# Patient Record
Sex: Female | Born: 1995
Health system: Southern US, Community
[De-identification: ages and names within clinical notes are randomized; demographics above are authoritative.]

## PROBLEM LIST (undated history)

## (undated) ENCOUNTER — Emergency Department (HOSPITAL_COMMUNITY): Admission: EM | Payer: Medicaid Other

## (undated) DIAGNOSIS — R87629 Unspecified abnormal cytological findings in specimens from vagina: Secondary | ICD-10-CM

## (undated) DIAGNOSIS — B009 Herpesviral infection, unspecified: Secondary | ICD-10-CM

## (undated) HISTORY — PX: WISDOM TOOTH EXTRACTION: SHX21

## (undated) HISTORY — DX: Unspecified abnormal cytological findings in specimens from vagina: R87.629

---
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
## 2015-07-03 ENCOUNTER — Emergency Department (HOSPITAL_COMMUNITY): Payer: Worker's Compensation

## 2015-07-03 ENCOUNTER — Emergency Department (HOSPITAL_COMMUNITY)
Admission: EM | Admit: 2015-07-03 | Discharge: 2015-07-03 | Disposition: A | Discharge status: Home / Self Care | Payer: Worker's Compensation | Attending: Emergency Medicine | Admitting: Emergency Medicine

## 2015-07-03 ENCOUNTER — Encounter (HOSPITAL_COMMUNITY): Payer: Self-pay | Admitting: *Deleted

## 2015-07-03 DIAGNOSIS — S92355A Nondisplaced fracture of fifth metatarsal bone, left foot, initial encounter for closed fracture: Secondary | ICD-10-CM | POA: Diagnosis not present

## 2015-07-03 DIAGNOSIS — Y9289 Other specified places as the place of occurrence of the external cause: Secondary | ICD-10-CM | POA: Diagnosis not present

## 2015-07-03 DIAGNOSIS — W231XXA Caught, crushed, jammed, or pinched between stationary objects, initial encounter: Secondary | ICD-10-CM | POA: Diagnosis not present

## 2015-07-03 DIAGNOSIS — Y9389 Activity, other specified: Secondary | ICD-10-CM | POA: Insufficient documentation

## 2015-07-03 DIAGNOSIS — S92245A Nondisplaced fracture of medial cuneiform of left foot, initial encounter for closed fracture: Secondary | ICD-10-CM | POA: Diagnosis not present

## 2015-07-03 DIAGNOSIS — T148XXA Other injury of unspecified body region, initial encounter: Secondary | ICD-10-CM

## 2015-07-03 DIAGNOSIS — S9782XA Crushing injury of left foot, initial encounter: Secondary | ICD-10-CM | POA: Diagnosis not present

## 2015-07-03 DIAGNOSIS — Z720 Tobacco use: Secondary | ICD-10-CM | POA: Insufficient documentation

## 2015-07-03 DIAGNOSIS — S99922A Unspecified injury of left foot, initial encounter: Secondary | ICD-10-CM | POA: Diagnosis present

## 2015-07-03 DIAGNOSIS — Y990 Civilian activity done for income or pay: Secondary | ICD-10-CM | POA: Diagnosis not present

## 2015-07-03 DIAGNOSIS — S92902A Unspecified fracture of left foot, initial encounter for closed fracture: Secondary | ICD-10-CM

## 2015-07-03 MED ORDER — ONDANSETRON HCL 4 MG/2ML IJ SOLN
4.0000 mg | Freq: Once | INTRAMUSCULAR | Status: AC
  Administered 2015-07-03: 4 mg via INTRAVENOUS
  Filled 2015-07-03: qty 2

## 2015-07-03 MED ORDER — HYDROMORPHONE HCL 1 MG/ML IJ SOLN
1.0000 mg | Freq: Once | INTRAMUSCULAR | Status: AC
  Administered 2015-07-03: 1 mg via INTRAVENOUS
  Filled 2015-07-03: qty 1

## 2015-07-03 MED ORDER — OXYCODONE-ACETAMINOPHEN 5-325 MG PO TABS: 1.0000 | ORAL_TABLET | ORAL | Status: DC | PRN

## 2015-07-03 NOTE — ED Notes (Signed)
Pt is in stable condition upon d/c and is escorted from ED via wheelchair. This RN helped pt into car and explained in length how to properly care for her broken foot. Pt expresses understanding.

## 2015-07-03 NOTE — ED Provider Notes (Signed)
SPLINT APPLICATION Date/Time: 6:24 PM Authorized by: Joya GaskinsWICKLINE,Rodarius Kichline W Consent: Verbal consent obtained. Risks and benefits: risks, benefits and alternatives were discussed Consent given by: patient Splint applied by: orthopedic technician Location details: left foot Splint type: bulky jones dressing per dr Roda Shuttersxu Post-procedure: The splinted body part was neurovascularly unchanged following the procedure. Patient tolerance: Patient tolerated the procedure well with no immediate complications.     April Rhineonald Halea Lieb, MD 07/03/15 [phone]

## 2015-07-03 NOTE — ED Provider Notes (Signed)
Dr Roda Shuttersxu is comfortable with CT results D/c patient home and f/u next week She is to wear a bulky jones dressing/crutches    Zadie Rhineonald Baylin Cabal, MD 07/03/15 1735

## 2015-07-03 NOTE — ED Notes (Signed)
[facility] 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 Pt is tearful upon arrival. Pt has obvious deformities to left foot. Pt also has complaints to right foot and ankle.

## 2015-07-03 NOTE — ED Notes (Signed)
Pt ambulates to bathroom on crutches with no problems. Pt provided UDS for worker's comp.

## 2015-07-03 NOTE — ED Notes (Signed)
Pt refuses to demonstrate use of crutches with ortho tech. This RN states we need to see her use the crutches before she can leave. The pt then yells, "I don't know how to do this!". Pt asked to not yell at this RN and instructed again on how to properly use crutches. The family is standing with pt.

## 2015-07-03 NOTE — Progress Notes (Signed)
Orthopedic Tech Progress Note Patient Details:  April HammersmithRaha S Shah 03/19/1996 [id number]  Ortho Devices Type of Ortho Device: Ace wrap, April Simmondsobert Jones splint Ortho Device/Splint Interventions: Application   April FordyceJennifer C Lucas Shah 07/03/2015, 6:20 PM

## 2015-07-03 NOTE — ED Notes (Signed)
Patient transported to X-ray 

## 2015-07-03 NOTE — ED Provider Notes (Signed)
CSN: [id number]     Arrival date & time 07/03/15  1327 History   First MD Initiated Contact with Patient 07/03/15 1338     Chief Complaint  Patient presents with  . Foot Pain     (Consider location/radiation/quality/duration/timing/severity/associated sxs/prior Treatment) HPI   Pt presents with left foot and ankle pain that is sharp, throbbing, aching, constant, severe, worse with movement and palpation and mild right heel pain that occurred after foot being caught between forklift and railing at work.  Was given  morphine en route by EMS with no improvement of pain.  Denies weakness or numbness of the feet.  Denies injury.    History reviewed. No pertinent past medical history. History reviewed. No pertinent past surgical history. No family history on file. Social History  Substance Use Topics  . Smoking status: Current Every Day Smoker -- 0.25 packs/day    Types: Cigarettes  . Smokeless tobacco: None  . Alcohol Use: No   OB History    No data available     Review of Systems  Constitutional: Negative for fever.  Cardiovascular: Negative for leg swelling.  Musculoskeletal: Positive for arthralgias and gait problem.  Skin: Positive for color change.  Allergic/Immunologic: Negative for immunocompromised state.  Neurological: Negative for weakness and numbness.  Psychiatric/Behavioral: Negative for self-injury (accidental).      Allergies  Review of patient's allergies indicates not on file.  Home Medications   Prior to Admission medications   Not on File   Temp(Src) 97.8 F (36.6 C) (Oral)  Ht  (1.549 m)  Wt 115 lb (52.164 kg)  BMI 21.74 kg/m2  SpO2 98% Physical Exam  Constitutional: She appears well-developed and well-nourished.  Uncomfortable, tearful   HENT:  Head: Normocephalic and atraumatic.  Neck: Neck supple.  Pulmonary/Chest: Effort normal.  Musculoskeletal:       Right ankle: Tenderness.       Left ankle: She exhibits no swelling, no  ecchymosis, no deformity and no laceration. Tenderness.       Right lower leg: Normal.       Left lower leg: Normal.       Right foot: There is tenderness.       Left foot: There is tenderness, bony tenderness, swelling and deformity. There is normal capillary refill.       Feet:  Bilateral feet - sensation intact, moves toes, capillary refill < 3 seconds.   Neurological: She is alert.  Skin: She is not diaphoretic.  Nursing note and vitals reviewed.   ED Course  Procedures (including critical care time) Labs Review Labs Reviewed - No data to display  Imaging Review Dg Ankle Complete Left  07/03/2015  CLINICAL DATA:  19 year old female with a history of injury at work. EXAM: LEFT ANKLE COMPLETE - 3+ VIEW COMPARISON:  None. FINDINGS: There is no evidence of fracture, dislocation, or joint effusion. There is no evidence of arthropathy or other focal bone abnormality. Soft tissues are unremarkable. IMPRESSION: Negative. Signed, Yvone Neu. Loreta Ave, DO [facility] Electronically Signed   By: Gilmer Mor D.O.   On: 07/03/2015 14:41   Dg Ankle Complete Right  07/03/2015  CLINICAL DATA:  Injury at work pain to medial malleolus EXAM: RIGHT ANKLE - COMPLETE 3+ VIEW COMPARISON:  None. FINDINGS: There is no evidence of fracture, dislocation, or joint effusion. There is no evidence of arthropathy or other focal bone abnormality. Soft tissues are unremarkable. IMPRESSION: Negative. Electronically Signed   By: Ladona Ridgel  Bradly ChrisStroud M.D.   On: 07/03/2015 14:41   Dg Foot Complete Left  07/03/2015  CLINICAL DATA:  Injury at work. EXAM: LEFT FOOT - COMPLETE 3+ VIEW COMPARISON:  None. FINDINGS: There is an acute fracture involving the distal shaft of the fifth metatarsal bone. Fracture involving the base of the fourth metatarsal bone is also identified. Nondisplaced fracture involving the medial cuneiform also identified. IMPRESSION: 1. Multiple fractures are  noted involving involving the medial cuneiform, base of fourth metatarsal bone and distal shaft of fifth metatarsal. Electronically Signed   By: Signa Kellaylor  Stroud M.D.   On: 07/03/2015 14:44   Dg Foot Complete Right  07/03/2015  CLINICAL DATA:  Injury at work. EXAM: RIGHT FOOT COMPLETE - 3+ VIEW COMPARISON:  None. FINDINGS: There is no evidence of fracture or dislocation. There is no evidence of arthropathy or other focal bone abnormality. Soft tissues are unremarkable. IMPRESSION: Negative. Electronically Signed   By: Signa Kellaylor  Stroud M.D.   On: 07/03/2015 14:40   I have personally reviewed and evaluated these images and lab results as part of my medical decision-making.   EKG Interpretation None       3:16 PM Discussed pt with Dr Roda ShuttersXu who recommends CT scan foot.   Pt updated on plan.    3:49 PM Discussed pt with Dr Bebe ShaggyWickline who assumes care of patient pending CT scan and discussion with Dr Roda ShuttersXu.   MDM   Final diagnoses:  Crush injury  Foot fracture, left, closed, initial encounter    Afebrile nontoxic patient with foot injury at work.  Neurovascularly intact. Xrays shows multiple fractures of left foot.  Discussed with Dr Roda ShuttersXu who recommends CT scan and discussion prior to decision regarding management.  Signed out to Dr Bebe ShaggyWickline at change of shift pending CT and discussion with Dr Roda ShuttersXu (orthopedics).    Trixie Dredgemily Sayra Frisby, PA-C 07/03/15 1551  Mirian MoMatthew Gentry, MD 07/08/15 1414

## 2017-01-03 DIAGNOSIS — Z6820 Body mass index (BMI) 20.0-20.9, adult: Secondary | ICD-10-CM | POA: Diagnosis not present

## 2017-01-03 DIAGNOSIS — Z01419 Encounter for gynecological examination (general) (routine) without abnormal findings: Secondary | ICD-10-CM | POA: Diagnosis not present

## 2017-06-27 DIAGNOSIS — Z6821 Body mass index (BMI) 21.0-21.9, adult: Secondary | ICD-10-CM | POA: Diagnosis not present

## 2017-06-27 DIAGNOSIS — J321 Chronic frontal sinusitis: Secondary | ICD-10-CM | POA: Diagnosis not present

## 2017-10-04 DIAGNOSIS — Z3202 Encounter for pregnancy test, result negative: Secondary | ICD-10-CM | POA: Diagnosis not present

## 2017-10-04 DIAGNOSIS — Z6821 Body mass index (BMI) 21.0-21.9, adult: Secondary | ICD-10-CM | POA: Diagnosis not present

## 2017-10-04 DIAGNOSIS — Z113 Encounter for screening for infections with a predominantly sexual mode of transmission: Secondary | ICD-10-CM | POA: Diagnosis not present

## 2017-10-04 DIAGNOSIS — B977 Papillomavirus as the cause of diseases classified elsewhere: Secondary | ICD-10-CM | POA: Diagnosis not present

## 2017-10-10 DIAGNOSIS — Z3049 Encounter for surveillance of other contraceptives: Secondary | ICD-10-CM | POA: Diagnosis not present

## 2017-10-18 DIAGNOSIS — R05 Cough: Secondary | ICD-10-CM | POA: Diagnosis not present

## 2017-10-18 DIAGNOSIS — F17200 Nicotine dependence, unspecified, uncomplicated: Secondary | ICD-10-CM | POA: Diagnosis not present

## 2017-10-18 DIAGNOSIS — J111 Influenza due to unidentified influenza virus with other respiratory manifestations: Secondary | ICD-10-CM | POA: Diagnosis not present

## 2017-10-18 DIAGNOSIS — J1089 Influenza due to other identified influenza virus with other manifestations: Secondary | ICD-10-CM | POA: Diagnosis not present

## 2017-10-25 DIAGNOSIS — Z3202 Encounter for pregnancy test, result negative: Secondary | ICD-10-CM | POA: Diagnosis not present

## 2017-10-25 DIAGNOSIS — Z3046 Encounter for surveillance of implantable subdermal contraceptive: Secondary | ICD-10-CM | POA: Diagnosis not present

## 2018-01-29 DIAGNOSIS — Z6822 Body mass index (BMI) 22.0-22.9, adult: Secondary | ICD-10-CM | POA: Diagnosis not present

## 2018-01-29 DIAGNOSIS — J0101 Acute recurrent maxillary sinusitis: Secondary | ICD-10-CM | POA: Diagnosis not present

## 2018-05-09 DIAGNOSIS — Z0000 Encounter for general adult medical examination without abnormal findings: Secondary | ICD-10-CM | POA: Diagnosis not present

## 2018-05-09 DIAGNOSIS — J0101 Acute recurrent maxillary sinusitis: Secondary | ICD-10-CM | POA: Diagnosis not present

## 2018-05-09 DIAGNOSIS — Z6822 Body mass index (BMI) 22.0-22.9, adult: Secondary | ICD-10-CM | POA: Diagnosis not present

## 2018-05-09 DIAGNOSIS — Z6821 Body mass index (BMI) 21.0-21.9, adult: Secondary | ICD-10-CM | POA: Diagnosis not present

## 2018-10-09 DIAGNOSIS — N898 Other specified noninflammatory disorders of vagina: Secondary | ICD-10-CM | POA: Diagnosis not present

## 2018-10-23 DIAGNOSIS — N898 Other specified noninflammatory disorders of vagina: Secondary | ICD-10-CM | POA: Diagnosis not present

## 2018-10-31 DIAGNOSIS — Z6821 Body mass index (BMI) 21.0-21.9, adult: Secondary | ICD-10-CM | POA: Diagnosis not present

## 2018-10-31 DIAGNOSIS — A6004 Herpesviral vulvovaginitis: Secondary | ICD-10-CM | POA: Diagnosis not present

## 2019-02-07 DIAGNOSIS — Z880 Allergy status to penicillin: Secondary | ICD-10-CM | POA: Diagnosis not present

## 2019-02-07 DIAGNOSIS — R05 Cough: Secondary | ICD-10-CM | POA: Diagnosis not present

## 2019-02-07 DIAGNOSIS — F17200 Nicotine dependence, unspecified, uncomplicated: Secondary | ICD-10-CM | POA: Diagnosis not present

## 2019-02-07 DIAGNOSIS — Z20828 Contact with and (suspected) exposure to other viral communicable diseases: Secondary | ICD-10-CM | POA: Diagnosis not present

## 2019-02-07 DIAGNOSIS — J029 Acute pharyngitis, unspecified: Secondary | ICD-10-CM | POA: Diagnosis not present

## 2019-02-07 DIAGNOSIS — R0989 Other specified symptoms and signs involving the circulatory and respiratory systems: Secondary | ICD-10-CM | POA: Diagnosis not present

## 2019-02-10 DIAGNOSIS — R05 Cough: Secondary | ICD-10-CM | POA: Diagnosis not present

## 2019-02-10 DIAGNOSIS — Z20828 Contact with and (suspected) exposure to other viral communicable diseases: Secondary | ICD-10-CM | POA: Diagnosis not present

## 2019-02-10 DIAGNOSIS — R0981 Nasal congestion: Secondary | ICD-10-CM | POA: Diagnosis not present

## 2019-02-10 DIAGNOSIS — R5383 Other fatigue: Secondary | ICD-10-CM | POA: Diagnosis not present

## 2019-02-10 DIAGNOSIS — R51 Headache: Secondary | ICD-10-CM | POA: Diagnosis not present

## 2019-02-17 DIAGNOSIS — J301 Allergic rhinitis due to pollen: Secondary | ICD-10-CM | POA: Diagnosis not present

## 2019-02-17 DIAGNOSIS — Z6821 Body mass index (BMI) 21.0-21.9, adult: Secondary | ICD-10-CM | POA: Diagnosis not present

## 2020-07-20 ENCOUNTER — Other Ambulatory Visit: Payer: Self-pay

## 2020-07-20 ENCOUNTER — Ambulatory Visit
Admission: EM | Admit: 2020-07-20 | Discharge: 2020-07-20 | Disposition: A | Discharge status: Home / Self Care | Payer: No Typology Code available for payment source

## 2020-07-20 DIAGNOSIS — A6004 Herpesviral vulvovaginitis: Secondary | ICD-10-CM | POA: Diagnosis not present

## 2020-07-20 LAB — POCT URINE PREGNANCY: Preg Test, Ur: NEGATIVE

## 2020-07-20 MED ORDER — ACYCLOVIR 5 % EX OINT: 1.0000 "application " | TOPICAL_OINTMENT | CUTANEOUS | 0 refills | Status: DC

## 2020-07-20 MED ORDER — ACYCLOVIR 800 MG PO TABS: 800.0000 mg | ORAL_TABLET | Freq: Two times a day (BID) | ORAL | 0 refills | Status: AC

## 2020-07-20 NOTE — Discharge Instructions (Addendum)
Urine pregnancy negative Acyclovir prescribed.  Take as directed and to completion Follow up with PCP for recheck Return here or go to ER if you have any new or worsening symptoms fever, chills, nausea, vomiting, redness, swelling, vaginal discharge, etc..Marland Kitchen

## 2020-07-20 NOTE — ED Triage Notes (Signed)
Pt presents with herpes outbreak for past 2 weeks, has been taking 500 mg valtrex daily but isnt working, pt is also concerned with pregnancy .

## 2020-07-20 NOTE — ED Provider Notes (Signed)
[facility]   [id number] 07/20/20 Arrival Time: 1438   CC: Herpes outbreak  SUBJECTIVE:  April Shah is a 24 y.o. female who presents with herpes outbreak x 4-5 days.  Reports increase in stress and recently sexual activity as well as starting menstrual cycle.  Has been taking 500 mg daily of valtrex without relief.  Reports similar symptoms in the past that improved with acyclovir cream.  Denies fever, chills, nausea, vomiting, abdominal or pelvic pain, urinary symptoms, vaginal itching, vaginal odor, vaginal bleeding, dyspareunia, vaginal rashes.  Also request pregnancy test.    ROS: As per HPI.  All other pertinent ROS negative.     History reviewed. No pertinent past medical history. History reviewed. No pertinent surgical history. Allergies  Allergen Reactions  . Penicillins Anaphylaxis   No current facility-administered medications on file prior to encounter.   Current Outpatient Medications on File Prior to Encounter  Medication Sig Dispense Refill  . etonogestrel (NEXPLANON) 68 MG IMPL implant 1 each by Subdermal route once.    Marland Kitchen oxyCODONE-acetaminophen (PERCOCET/ROXICET) 5-325 MG tablet Take 1 tablet by mouth every 4 (four) hours as needed for severe pain. 15 tablet 0   Social History   Socioeconomic History  . Marital status: Married    Spouse name: Not on file  . Number of children: Not on file  . Years of education: Not on file  . Highest education level: Not on file  Occupational History  . Not on file  Tobacco Use  . Smoking status: Current Every Day Smoker    Packs/day: 0.25    Types: Cigarettes  Substance and Sexual Activity  . Alcohol use: No  . Drug use: No  . Sexual activity: Not on file  Other Topics Concern  . Not on file  Social History Narrative  . Not on file   Social Determinants of Health   Financial Resource Strain:   . Difficulty of Paying Living Expenses: Not on file  Food Insecurity:   . Worried About Brewing technologist in the Last Year: Not on file  . Ran Out of Food in the Last Year: Not on file  Transportation Needs:   . Lack of Transportation (Medical): Not on file  . Lack of Transportation (Non-Medical): Not on file  Physical Activity:   . Days of Exercise per Week: Not on file  . Minutes of Exercise per Session: Not on file  Stress:   . Feeling of Stress : Not on file  Social Connections:   . Frequency of Communication with Friends and Family: Not on file  . Frequency of Social Gatherings with Friends and Family: Not on file  . Attends Religious Services: Not on file  . Active Member of Clubs or Organizations: Not on file  . Attends Banker Meetings: Not on file  . Marital Status: Not on file  Intimate Partner Violence:   . Fear of Current or Ex-Partner: Not on file  . Emotionally Abused: Not on file  . Physically Abused: Not on file  . Sexually Abused: Not on file   History reviewed. No pertinent family history.  OBJECTIVE:  Vitals:   07/20/20 1453  BP: 124/74  Pulse: 86  Resp: 18  Temp: 99 F (37.2 C)  SpO2: 99%     General appearance: alert, NAD, appears stated age Head: NCAT Throat: lips, mucosa, and tongue normal; teeth and gums normal Lungs: CTA bilaterally without adventitious breath sounds Heart: regular rate and rhythm.  Radial pulses 2+ symmetrical bilaterally Back: no CVA tenderness Abdomen: soft, non-tender; bowel sounds normal; no guarding or rebound tenderness GU: deferred Skin: warm and dry Psychological:  Alert and cooperative. Normal mood and affect.  LABS:  Results for orders placed or performed during the hospital encounter of 07/20/20 (from the past 24 hour(s))  POCT urine pregnancy     Status: None   Collection Time: 07/20/20  3:00 PM  Result Value Ref Range   Preg Test, Ur Negative Negative     Labs Reviewed  POCT URINE PREGNANCY    ASSESSMENT & PLAN:  1. Herpes simplex vulvovaginitis     Meds ordered this encounter    Medications  . acyclovir (ZOVIRAX) 800 MG tablet    Sig: Take 1 tablet (800 mg total) by mouth 2 (two) times daily for 5 days.    Dispense:  10 tablet    Refill:  0    Order Specific Question:   Supervising Provider    Answer:   Eustace Moore [[id number]]  . acyclovir ointment (ZOVIRAX) 5 %    Sig: Apply 1 application topically every 3 (three) hours.    Dispense:  30 g    Refill:  0    Order Specific Question:   Supervising Provider    Answer:   Eustace Moore [[id number]]    Pending: Labs Reviewed  POCT URINE PREGNANCY    Urine pregnancy negative Acyclovir prescribed.  Take as directed and to completion Follow up with PCP for recheck Return here or go to ER if you have any new or worsening symptoms fever, chills, nausea, vomiting, redness, swelling, vaginal discharge, etc...  Reviewed expectations re: course of current medical issues. Questions answered. Outlined signs and symptoms indicating need for more acute intervention. Patient verbalized understanding. After Visit Summary given.       Rennis Harding, PA-C 07/20/20 1513

## 2021-09-05 DIAGNOSIS — Z01419 Encounter for gynecological examination (general) (routine) without abnormal findings: Secondary | ICD-10-CM | POA: Diagnosis not present

## 2021-12-06 ENCOUNTER — Ambulatory Visit
Admission: EM | Admit: 2021-12-06 | Discharge: 2021-12-06 | Disposition: A | Discharge status: Home / Self Care | Payer: 59

## 2021-12-06 ENCOUNTER — Encounter: Payer: Self-pay | Admitting: Emergency Medicine

## 2021-12-06 ENCOUNTER — Ambulatory Visit (INDEPENDENT_AMBULATORY_CARE_PROVIDER_SITE_OTHER): Payer: 59

## 2021-12-06 DIAGNOSIS — S90812A Abrasion, left foot, initial encounter: Secondary | ICD-10-CM | POA: Diagnosis not present

## 2021-12-06 DIAGNOSIS — S9032XA Contusion of left foot, initial encounter: Secondary | ICD-10-CM | POA: Diagnosis not present

## 2021-12-06 DIAGNOSIS — M79672 Pain in left foot: Secondary | ICD-10-CM

## 2021-12-06 DIAGNOSIS — S90819A Abrasion, unspecified foot, initial encounter: Secondary | ICD-10-CM

## 2021-12-06 HISTORY — DX: Herpesviral infection, unspecified: B00.9

## 2021-12-06 MED ORDER — NAPROXEN 500 MG PO TABS: 500.0000 mg | ORAL_TABLET | Freq: Two times a day (BID) | ORAL | 0 refills | Status: DC

## 2021-12-06 MED ORDER — TETANUS-DIPHTH-ACELL PERTUSSIS 5-2.5-18.5 LF-MCG/0.5 IM SUSY
0.5000 mL | PREFILLED_SYRINGE | Freq: Once | INTRAMUSCULAR | Status: AC
  Administered 2021-12-06: 0.5 mL via INTRAMUSCULAR

## 2021-12-06 NOTE — ED Provider Notes (Signed)
?[place] ? ? ?MRN: [id number] DOB: 1996-08-03 ? ?Subjective:  ? ?April Shah is a 26 y.o. female presenting for suffering a right foot injury today.  Patient states that she was hurrying to get in a car away from a dog.  Unfortunately she did not pull her foot all the way in before closing the door on her left foot.  This caused a skin tear and has since had pain and swelling.  Wanted to make sure that she did not have a fracture.  Tdap is not up-to-date. ? ?No current facility-administered medications for this encounter. ? ?Current Outpatient Medications:  ?  valACYclovir (VALTREX) 500 MG tablet, Take 500 mg by mouth 2 (two) times daily., Disp: , Rfl:   ? ?No Known Allergies ? ?Past Medical History:  ?Diagnosis Date  ? Herpes simplex   ?  ? ?History reviewed. No pertinent surgical history. ? ?History reviewed. No pertinent family history. ? ?Social History  ? ?Tobacco Use  ? Smoking status: Former  ?  Types: Cigarettes  ? Smokeless tobacco: Never  ?Vaping Use  ? Vaping Use: Never used  ?Substance Use Topics  ? Alcohol use: Never  ? Drug use: Never  ? ? ?ROS ? ? ?Objective:  ? ?Vitals: ?BP 110/63 (BP Location: Right Arm)   Pulse 83   Temp 98.8 ?F (37.1 ?C) (Oral)   Resp 18   LMP 11/21/2021 (Exact Date)   SpO2 97%  ? ?Physical Exam ?Constitutional:   ?   General: She is not in acute distress. ?   Appearance: Normal appearance. She is well-developed. She is not ill-appearing, toxic-appearing or diaphoretic.  ?HENT:  ?   Head: Normocephalic and atraumatic.  ?   Nose: Nose normal.  ?   Mouth/Throat:  ?   Mouth: Mucous membranes are moist.  ?Eyes:  ?   General: No scleral icterus.    ?   Right eye: No discharge.     ?   Left eye: No discharge.  ?   Extraocular Movements: Extraocular movements intact.  ?Cardiovascular:  ?   Rate and Rhythm: Normal rate.  ?Pulmonary:  ?   Effort: Pulmonary effort is normal.  ?Musculoskeletal:  ?     Feet: ? ?Skin: ?   General: Skin is warm and dry.   ?Neurological:  ?   General: No focal deficit present.  ?   Mental Status: She is alert and oriented to person, place, and time.  ?Psychiatric:     ?   Mood and Affect: Mood normal.     ?   Behavior: Behavior normal.  ? ? ?DG Foot Complete Left ? ?Result Date: 12/06/2021 ?CLINICAL DATA:  Strut foot in car door. Fracture 5 years ago. Lateral foot pain. EXAM: LEFT FOOT - COMPLETE 3+ VIEW COMPARISON:  None. FINDINGS: There is mild-to-moderate varus angulation of the tarsometatarsal joints diffusely, appearing chronic. Joint spaces are preserved. No acute fracture is seen. No dislocation. IMPRESSION: No acute fracture. Electronically Signed   By: Yvonne Kendall M.D.   On: 12/06/2021 18:14   ? ? ?Assessment and Plan :  ? ?PDMP not reviewed this encounter. ? ?1. Acute foot pain, left   ?2. Abrasion, foot w/o infection   ?3. Contusion of left foot, initial encounter   ? ?Tdap updated in clinic today.  Recommended naproxen for pain and inflammation.  I applied a dressing to the avulsion laceration as it is not amenable to laceration repair.  Recommended rice method for her  foot contusion. Counseled patient on potential for adverse effects with medications prescribed/recommended today, ER and return-to-clinic precautions discussed, patient verbalized understanding. ? ?  ?Jaynee Eagles, PA-C ?12/06/21 1829 ? ?

## 2021-12-06 NOTE — ED Triage Notes (Signed)
Shut left foot in car door earlier today. Pain and swelling ?

## 2022-05-24 ENCOUNTER — Encounter (HOSPITAL_COMMUNITY): Payer: Self-pay | Admitting: *Deleted

## 2022-05-24 ENCOUNTER — Other Ambulatory Visit: Payer: Self-pay

## 2022-05-24 DIAGNOSIS — O200 Threatened abortion: Secondary | ICD-10-CM | POA: Diagnosis not present

## 2022-05-24 DIAGNOSIS — O209 Hemorrhage in early pregnancy, unspecified: Secondary | ICD-10-CM | POA: Insufficient documentation

## 2022-05-24 DIAGNOSIS — Z3A01 Less than 8 weeks gestation of pregnancy: Secondary | ICD-10-CM | POA: Diagnosis not present

## 2022-05-24 NOTE — ED Triage Notes (Signed)
Pt with lower abd cramping and spotting.  Pt not sure how far along she is pregnant, just found out not too long ago she was pregnant few days ago by home pregnancy test.

## 2022-05-25 ENCOUNTER — Emergency Department (HOSPITAL_COMMUNITY): Payer: 59

## 2022-05-25 ENCOUNTER — Emergency Department (HOSPITAL_COMMUNITY)
Admission: EM | Admit: 2022-05-25 | Discharge: 2022-05-25 | Disposition: A | Discharge status: Home / Self Care | Payer: 59 | Attending: Emergency Medicine | Admitting: Emergency Medicine

## 2022-05-25 DIAGNOSIS — Z3A01 Less than 8 weeks gestation of pregnancy: Secondary | ICD-10-CM | POA: Diagnosis not present

## 2022-05-25 DIAGNOSIS — O200 Threatened abortion: Secondary | ICD-10-CM

## 2022-05-25 DIAGNOSIS — O209 Hemorrhage in early pregnancy, unspecified: Secondary | ICD-10-CM | POA: Diagnosis not present

## 2022-05-25 LAB — CBC WITH DIFFERENTIAL/PLATELET
Abs Immature Granulocytes: 0 10*3/uL (ref 0.00–0.07)
Basophils Absolute: 0 10*3/uL (ref 0.0–0.1)
Basophils Relative: 0 %
Eosinophils Absolute: 0.1 10*3/uL (ref 0.0–0.5)
Eosinophils Relative: 2 %
HCT: 37.2 % (ref 36.0–46.0)
Hemoglobin: 12.4 g/dL (ref 12.0–15.0)
Immature Granulocytes: 0 %
Lymphocytes Relative: 53 %
Lymphs Abs: 2.9 10*3/uL (ref 0.7–4.0)
MCH: 28.9 pg (ref 26.0–34.0)
MCHC: 33.3 g/dL (ref 30.0–36.0)
MCV: 86.7 fL (ref 80.0–100.0)
Monocytes Absolute: 0.6 10*3/uL (ref 0.1–1.0)
Monocytes Relative: 11 %
Neutro Abs: 1.8 10*3/uL (ref 1.7–7.7)
Neutrophils Relative %: 34 %
Platelets: 237 10*3/uL (ref 150–400)
RBC: 4.29 MIL/uL (ref 3.87–5.11)
RDW: 12.2 % (ref 11.5–15.5)
WBC: 5.3 10*3/uL (ref 4.0–10.5)
nRBC: 0 % (ref 0.0–0.2)

## 2022-05-25 LAB — BASIC METABOLIC PANEL
Anion gap: 9 (ref 5–15)
BUN: 9 mg/dL (ref 6–20)
CO2: 20 mmol/L — ABNORMAL LOW (ref 22–32)
Calcium: 9.1 mg/dL (ref 8.9–10.3)
Chloride: 108 mmol/L (ref 98–111)
Creatinine, Ser: 0.72 mg/dL (ref 0.44–1.00)
GFR, Estimated: >60 mL/min (ref >60)
Glucose, Bld: 87 mg/dL (ref 70–99)
Potassium: 3.7 mmol/L (ref 3.5–5.1)
Sodium: 137 mmol/L (ref 135–145)

## 2022-05-25 LAB — HCG, QUANTITATIVE, PREGNANCY: hCG, Beta Chain, Quant, S: 19 m[IU]/mL — ABNORMAL HIGH (ref <5)

## 2022-05-25 MED ORDER — ACETAMINOPHEN 325 MG PO TABS
650.0000 mg | ORAL_TABLET | Freq: Once | ORAL | Status: AC
  Administered 2022-05-25: 650 mg via ORAL
  Filled 2022-05-25: qty 2

## 2022-05-25 MED ORDER — HYDROCODONE-ACETAMINOPHEN 5-325 MG PO TABS
2.0000 | ORAL_TABLET | Freq: Once | ORAL | Status: DC
  Filled 2022-05-25: qty 2

## 2022-05-25 NOTE — Discharge Instructions (Signed)
Follow-up with your OB/GYN in the next few days for a repeat hCG level.  Return to the emergency department if you develop worsening pain, severe bleeding, high fever, or for other new and concerning symptoms.

## 2022-05-25 NOTE — ED Provider Notes (Signed)
[facility] Provider Note   CSN: [id number] Arrival date & time: 05/24/22  2301     History  No chief complaint on file.   April Shah is a 26 y.o. female.  Patient is a 26 year old female G3 P1-0-1-1 at early pregnancy.  Patient presenting with complaints of lower abdominal cramping and vaginal bleeding that started earlier this evening.  She denies any fever or chills.  She took a home pregnancy test 2 days ago that was positive.  Her last menstrual period was August 1.  She is concerned she may be having a miscarriage as she has had one in the past.  The history is provided by the patient.       Home Medications Prior to Admission medications   Medication Sig Start Date End Date Taking? Authorizing Provider  acyclovir ointment (ZOVIRAX) 5 % Apply 1 application topically every 3 (three) hours. 07/20/20   Wurst, [place], PA-C  etonogestrel (NEXPLANON) 68 MG IMPL implant 1 each by Subdermal route once.    [provider]  naproxen (NAPROSYN) 500 MG tablet Take 1 tablet (500 mg total) by mouth 2 (two) times daily with a meal. 12/06/21   Jaynee Eagles, PA-C  oxyCODONE-acetaminophen (PERCOCET/ROXICET) 5-325 MG tablet Take 1 tablet by mouth every 4 (four) hours as needed for severe pain. 07/03/15   Ripley Fraise, MD  valACYclovir (VALTREX) 500 MG tablet Take 500 mg by mouth 2 (two) times daily.    [provider]      Allergies    Penicillins    Review of Systems   Review of Systems  All other systems reviewed and are negative.   Physical Exam Updated Vital Signs BP 118/82 (BP Location: Right Arm)   Pulse 70   Temp 98.5 F (36.9 C) (Oral)   Resp 18   Ht 5\' 1"  (1.549 m)   Wt 60.6 kg   LMP 04/15/2022   SpO2 97%   BMI 25.22 kg/m  Physical Exam Vitals and nursing note reviewed.  Constitutional:      General: She is not in acute distress.    Appearance: She is well-developed. She is not diaphoretic.  HENT:     Head:  Normocephalic and atraumatic.  Cardiovascular:     Rate and Rhythm: Normal rate and regular rhythm.     Heart sounds: No murmur heard.    No friction rub. No gallop.  Pulmonary:     Effort: Pulmonary effort is normal. No respiratory distress.     Breath sounds: Normal breath sounds. No wheezing.  Abdominal:     General: Bowel sounds are normal. There is no distension.     Palpations: Abdomen is soft.     Tenderness: There is abdominal tenderness. There is no right CVA tenderness, left CVA tenderness, guarding or rebound.  Musculoskeletal:        General: Normal range of motion.     Cervical back: Normal range of motion and neck supple.  Skin:    General: Skin is warm and dry.  Neurological:     General: No focal deficit present.     Mental Status: She is alert and oriented to person, place, and time.     ED Results / Procedures / Treatments   Labs (all labs ordered are listed, but only abnormal results are displayed) Labs Reviewed  HCG, QUANTITATIVE, PREGNANCY - Abnormal; Notable for the following components:      Result Value   hCG, Beta Chain, Quant, S 19 (*)  All other components within normal limits  BASIC METABOLIC PANEL - Abnormal; Notable for the following components:   CO2 20 (*)    All other components within normal limits  CBC WITH DIFFERENTIAL/PLATELET  CBC WITH DIFFERENTIAL/PLATELET    EKG None  Radiology No results found.  Procedures Procedures    Medications Ordered in ED Medications - No data to display  ED Course/ Medical Decision Making/ A&P  Patient presenting with complaints of lower abdominal pain and vaginal bleeding.  She is concerned she may be having a miscarriage as she has had 1 in the past.  She had a home pregnancy test positive several days ago.  He arrives here afebrile with stable vital signs.  Pregnancy test is positive with a quantitative beta-hCG of 19.  Laboratory studies otherwise unremarkable and hemoglobin is normal.   Ultrasound obtained showing no evidence for intrauterine pregnancy.  Given the low hCG, I suspect miscarriage, but ectopic pregnancy has not fully been ruled out.  I feel as though patient can safely be discharged with interval ultrasound follow-up and repeat hCG by her OB/GYN.  Final Clinical Impression(s) / ED Diagnoses Final diagnoses:  None    Rx / DC Orders ED Discharge Orders     None         Veryl Speak, MD 05/25/22 0405

## 2022-05-25 NOTE — ED Notes (Signed)
Patient verbalizes understanding of discharge instructions. Opportunity for questioning and answers were provided. Armband removed by staff, pt discharged from ED. Ambulated out to lobby  

## 2022-06-06 ENCOUNTER — Encounter: Payer: 59 | Admitting: Obstetrics & Gynecology

## 2022-06-06 DIAGNOSIS — Z0289 Encounter for other administrative examinations: Secondary | ICD-10-CM

## 2022-08-28 NOTE — L&D Delivery Note (Signed)
Faculty Note  In to assess patient, she is pushing well with contractions. Started to have some fetal heart rate decels to 80s with pushing. Fetal head at +3 station with pushing, felt to be LOA. Patient counseled regarding recommendation for vacuum assisted vaginal delivery to facilitate delivery given non-reassuring fetal tracing. Reviewed risks of vacuum assisted delivery including risk of cephalohematoma, subgaleal hemorrhage, failed vacuum or dystocia requiring emergency c-section, risks of prolonged hypoxia in fetus, risks of damage to maternal tissue. She verbalizes understanding of the above and is agreeable to a vacuum assisted delivery.  Kiwi suction cup applied to fetal head, 2 cm anterior to the posterior fontanelle and equidistant across the sagittal sutures. The cup was inspected to ensure to vaginal tissue free from cup. Suction increased to 450 mm Hg and gentle traction placed on vacuum device during contraction to assist maternal expulsive forces. Total applied pressure time 3 pulls. No popoffs occurred however once fetal head started crowning, Kiwi would not hold suction. Removed FSE and switched to bell cup. Suction increased to 450 mm Hg and head delivered with one pull/push. Suction released with delivery of fetal head. Infant delivered easily from LOA position. Cord clamped and cut and infant handed to waiting pedi staff who inspected infant and found no evidence of cephalohematoma or similar injury.  Cord blood obtained. Placenta delivered spontaneously and intact, 3VC. Uterus firm. Right labial laceration noted, repaired with 3-0 monocryl in usual fashion. 1% lidocaine injected at area for additional pain relief. Left labial laceration hemostatic and not repaired. Posterior laceration noted hemostatic and requiring no repair. Small left vaginal wall hematoma noted. Weight 2320 gms, Apgars 4/9. EBL: 400 mL.    Baldemar Lenis, MD, [facility]ending [facility] for [employer]  [facility])

## 2023-01-15 ENCOUNTER — Telehealth: Payer: Self-pay | Admitting: *Deleted

## 2023-01-15 NOTE — Telephone Encounter (Signed)
Pt states during intercourse she and her partner felt a bulge. Pt states no pain or bleeding. Please advise.

## 2023-01-15 NOTE — Telephone Encounter (Signed)
States she and her partner had intercourse and he felt a "bulge" inside of her.  Denies pain or bleeding.  Informed patient it sounded like her cervix that he was feeling.  Since she was not having any bleeding or pain, advised to continue to monitor and if either occurred, to let us know. Pt verbalized understanding.

## 2023-01-30 ENCOUNTER — Other Ambulatory Visit: Payer: Self-pay | Admitting: Obstetrics & Gynecology

## 2023-01-30 DIAGNOSIS — O3680X0 Pregnancy with inconclusive fetal viability, not applicable or unspecified: Secondary | ICD-10-CM

## 2023-01-31 ENCOUNTER — Ambulatory Visit (INDEPENDENT_AMBULATORY_CARE_PROVIDER_SITE_OTHER): Payer: 59

## 2023-01-31 ENCOUNTER — Encounter: Payer: Self-pay | Admitting: Obstetrics & Gynecology

## 2023-01-31 ENCOUNTER — Ambulatory Visit: Payer: 59 | Admitting: Obstetrics & Gynecology

## 2023-01-31 DIAGNOSIS — Z3A10 10 weeks gestation of pregnancy: Secondary | ICD-10-CM

## 2023-01-31 DIAGNOSIS — O3680X0 Pregnancy with inconclusive fetal viability, not applicable or unspecified: Secondary | ICD-10-CM

## 2023-01-31 DIAGNOSIS — Z3481 Encounter for supervision of other normal pregnancy, first trimester: Secondary | ICD-10-CM | POA: Diagnosis not present

## 2023-01-31 NOTE — Progress Notes (Signed)
[place] 10+6 wks,single IUP,CRL 39.5 mm,FHR 150 bpm,normal ovaries

## 2023-02-07 ENCOUNTER — Encounter: Payer: Self-pay | Admitting: Advanced Practice Midwife

## 2023-02-08 ENCOUNTER — Ambulatory Visit (INDEPENDENT_AMBULATORY_CARE_PROVIDER_SITE_OTHER): Payer: 59 | Admitting: Advanced Practice Midwife

## 2023-02-08 ENCOUNTER — Encounter: Payer: 59 | Admitting: *Deleted

## 2023-02-08 ENCOUNTER — Encounter: Payer: Self-pay | Admitting: Advanced Practice Midwife

## 2023-02-08 ENCOUNTER — Other Ambulatory Visit (HOSPITAL_COMMUNITY)
Admission: RE | Admit: 2023-02-08 | Discharge: 2023-02-08 | Disposition: A | Discharge status: Home / Self Care | Payer: 59 | Source: Ambulatory Visit | Attending: Advanced Practice Midwife | Admitting: Advanced Practice Midwife

## 2023-02-08 VITALS — BP 126/73 | HR 67 | Wt 129.0 lb

## 2023-02-08 DIAGNOSIS — Z98891 History of uterine scar from previous surgery: Secondary | ICD-10-CM | POA: Diagnosis not present

## 2023-02-08 DIAGNOSIS — O09899 Supervision of other high risk pregnancies, unspecified trimester: Secondary | ICD-10-CM

## 2023-02-08 DIAGNOSIS — O09891 Supervision of other high risk pregnancies, first trimester: Secondary | ICD-10-CM

## 2023-02-08 DIAGNOSIS — B009 Herpesviral infection, unspecified: Secondary | ICD-10-CM | POA: Diagnosis not present

## 2023-02-08 DIAGNOSIS — Z363 Encounter for antenatal screening for malformations: Secondary | ICD-10-CM | POA: Diagnosis not present

## 2023-02-08 DIAGNOSIS — Z3A12 12 weeks gestation of pregnancy: Secondary | ICD-10-CM | POA: Diagnosis not present

## 2023-02-08 DIAGNOSIS — Z3480 Encounter for supervision of other normal pregnancy, unspecified trimester: Secondary | ICD-10-CM | POA: Insufficient documentation

## 2023-02-08 DIAGNOSIS — O0990 Supervision of high risk pregnancy, unspecified, unspecified trimester: Secondary | ICD-10-CM | POA: Insufficient documentation

## 2023-02-08 DIAGNOSIS — Z3490 Encounter for supervision of normal pregnancy, unspecified, unspecified trimester: Secondary | ICD-10-CM | POA: Insufficient documentation

## 2023-02-08 DIAGNOSIS — O09291 Supervision of pregnancy with other poor reproductive or obstetric history, first trimester: Secondary | ICD-10-CM

## 2023-02-08 MED ORDER — PROGESTERONE 200 MG PO CAPS: 200.0000 mg | ORAL_CAPSULE | Freq: Every day | ORAL | 4 refills | Status: DC

## 2023-02-08 NOTE — Progress Notes (Signed)
INITIAL OBSTETRICAL VISIT Patient name: April Shah MRN [id number]  Date of birth: 01-25-1996 Chief Complaint:   Initial Prenatal Visit (Muscle spasms in back , lower abdomin pressure and occasional vaginal)  History of Present Illness:   April Shah is a 27 y.o. G31P0101 African American female at [redacted]w[redacted]d by [place] at 10 weeks with an Estimated Date of Delivery: 08/23/23 being seen today for her initial obstetrical visit.   Her obstetrical history is significant for short cervix w/pessary; PTD at 32 weeks.  Used vagianl pg for a while, "it didn't work", went to a pessary.  Took the pessary out "she took it out and didn't know how to put it back in"  Delivered 3 weeks later.  Today she reports pelvic pressure, some muscle spasms in back.  Crying during visit.  Denies SI/HI. No hx depression.  "I feel so sad".  Thinks it's pregnancy hormones.  Denies domestic abuse.  Partner here at visit, discussed mental health/pregnancy, risk of PTD again.  .     02/08/2023   11:05 AM  Depression screen PHQ 2/9  Decreased Interest 3  Down, Depressed, Hopeless 3  PHQ - 2 Score 6  Altered sleeping 2  Tired, decreased energy 3  Change in appetite 1  Feeling bad or failure about yourself  3  Trouble concentrating 0  Moving slowly or fidgety/restless 0  Suicidal thoughts 0  PHQ-9 Score 15    Patient's last menstrual period was 12/08/2022. Last pap No results found for: "DIAGPAP" Review of Systems:   Pertinent items are noted in HPI Denies cramping/contractions, leakage of fluid, vaginal bleeding, abnormal vaginal discharge w/ itching/odor/irritation, headaches, visual changes, shortness of breath, chest pain, abdominal pain, severe nausea/vomiting, or problems with urination or bowel movements unless otherwise stated above.  Pertinent History Reviewed:  Reviewed past medical,surgical, social, obstetrical and family history.  Reviewed problem list, medications and allergies. OB History  Gravida Para Term  Preterm AB Living  2 1   1   1   SAB IAB Ectopic Multiple Live Births          1    # Outcome Date GA Lbr Len/2nd Weight Sex Delivery Anes PTL Lv  2 Current           1 Preterm 08/27/14 [redacted]w[redacted]d  5 lb 4 oz (2.381 kg) F CS-LTranv EPI N LIV     Complications: Short cervix   Physical Assessment:   Vitals:   02/08/23 1109  BP: 126/73  Pulse: 67  Weight: 129 lb (58.5 kg)  Body mass index is 24.37 kg/m.       Physical Examination:  General appearance - well appearing, and in no distress  Mental status - alert, oriented to person, place, and time  Psych:  She has a normal mood and affect  Skin - warm and dry, normal color, no suspicious lesions noted  Chest - effort normal  Heart - normal rate and regular rhythm  Abdomen - soft, nontender  Extremities:  No swelling or varicosities noted  Pelvic - VULVA: normal appearing vulva with no masses, tenderness or lesions  VAGINA: normal appearing vagina with normal color and discharge, no lesions  CERVIX: normal appearing cervix without discharge or lesions, no CMT.  Feels at least 50% effaced, closed.  Thin prep pap is done with HR HPV cotesting    No results found for this or any previous visit (from the past 24 hour(s)).   Indications for ASA therapy (per uptodate) One  of the following: Previous pregnancy with preeclampsia, especially early onset and with an adverse outcome No Multifetal gestation No Chronic hypertension No Type 1 or 2 diabetes mellitus No Chronic kidney disease No Autoimmune disease (antiphospholipid syndrome, systemic lupus erythematosus) No  Two or more of the following: Nulliparity No Obesity (body mass index >30 kg/m2) No Family history of preeclampsia in mother or sister No Age ?35 years No Sociodemographic characteristics (African American race, low socioeconomic level) Yes Personal risk factors (eg, previous pregnancy with low birth weight or small for gestational age infant, previous adverse pregnancy  outcome [eg, stillbirth], interval >10  no    02/08/2023   11:05 AM  Depression screen PHQ 2/9  Decreased Interest 3  Down, Depressed, Hopeless 3  PHQ - 2 Score 6  Altered sleeping 2  Tired, decreased energy 3  Change in appetite 1  Feeling bad or failure about yourself  3  Trouble concentrating 0  Moving slowly or fidgety/restless 0  Suicidal thoughts 0  PHQ-9 Score 15        02/08/2023   11:05 AM  GAD 7 : Generalized Anxiety Score  Nervous, Anxious, on Edge 2  Control/stop worrying 0  Worry too much - different things 2  Trouble relaxing 3  Restless 0  Easily annoyed or irritable 3  Afraid - awful might happen 3  Total GAD 7 Score 13      Assessment & Plan:  1) Low-Risk Pregnancy G2P0101 at [redacted]w[redacted]d with an Estimated Date of Delivery: 08/23/23   2) Initial OB visit    1. Supervision of other normal pregnancy, antepartum  - Urine Culture - CH[facility] AMB BABYSCRIPTS SCHEDULE OPTIMIZATION - CBC/D/Plt+RPR+Rh+ABO+RubIgG... - Cytology - PAP - PANORAMA PRENATAL TEST - Hgb Fractionation Cascade  2. [redacted] weeks gestation of pregnancy   3. History of cesarean delivery For Breech, may TOLAC  4. Antenatal screening for malformation using ultrasonics  - US OB Comp + 14 Wk; Future  5. History of prior pregnancy with short cervix, currently pregnant in first trimester and PTD at 32 weeks  Discussed w/[facility]:  prometrium, check cx length 14-16 weeks  6.  Depression/sadness:  strongly encouraged to seek appt w/[facility], info given.     Meds:  Meds ordered this encounter  Medications   progesterone (PROMETRIUM) 200 MG capsule    Sig: Place 1 capsule (200 mg total) vaginally daily.    Dispense:  30 capsule    Refill:  4    Order Specific Question:   Supervising Provider    Answer:   Duane Lope H [[id number]]    Initial labs obtained Continue prenatal vitamins Reviewed n/v relief measures and warning s/s to report Reviewed recommended weight gain based on pre-gravid  BMI Encouraged well-balanced diet Genetic & carrier screening discussed: requests Panorama and AFP, declines NT/IT and Horizon  Ultrasound discussed; fetal survey: requested CCNC completed> form faxed if has or is planning to apply for medicaid The nature of [facility] [employer] with multiple MDs and other Advanced Practice Providers was explained to patient; also emphasized that fellows, residents, and students are part of our team. Given home bp cuff. Check bp weekly, let us know if >140/90.        Scarlette Calico Cresenzo-Dishmon 1:57 PM

## 2023-02-08 NOTE — Patient Instructions (Addendum)
April Shah offers online women's holistic mental health counseling and therapy provided by licensed mental health counselors and therapists.   You can refer yourself using the link below: (if it isn't clickable from your mychart account, copy and paste it in a new browser).  If you have ANY problems, please let me know and I will help troubleshoot.   [email or web address]    April Shah, I greatly value your feedback.  If you receive a survey following your visit with [place] today, we appreciate you taking the time to fill it out.  Thanks, Cathie Beams, DNP, CNM  [facility] HAS MOVED!!! It is now [facility] ([address] [place], [place] [address]) Entrance located off of [address] Free 24/7 valet parking   Nausea & Vomiting Have saltine crackers or pretzels by your bed and eat a few bites before you raise your head out of bed in the morning Eat small frequent meals throughout the day instead of large meals Drink plenty of fluids throughout the day to stay hydrated, just don't drink a lot of fluids with your meals.  This can make your stomach fill up faster making you feel sick Do not brush your teeth right after you eat Products with real ginger are good for nausea, like ginger ale and ginger hard candy Make sure it says made with real ginger! Sucking on sour candy like lemon heads is also good for nausea If your prenatal vitamins make you nauseated, take them at night so you will sleep through the nausea Sea Bands If you feel like you need medicine for the nausea & vomiting please let us know If you are unable to keep any fluids or food down please let us know   Constipation Drink plenty of fluid, preferably water, throughout the day Eat foods high in fiber such as fruits, vegetables, and grains Exercise, such as walking, is a good way to keep your bowels regular Drink warm fluids, especially warm  prune juice, or decaf coffee Eat a 1/2 cup of real oatmeal (not instant), 1/2 cup applesauce, and 1/2-1 cup warm prune juice every day If needed, you may take Colace (docusate sodium) stool softener once or twice a day to help keep the stool soft.  If you still are having problems with constipation, you may take Miralax once daily as needed to help keep your bowels regular.   Home Blood Pressure Monitoring for Patients   Your provider has recommended that you check your blood pressure (BP) at least once a week at home. If you do not have a blood pressure cuff at home, one will be provided for you. Contact your provider if you have not received your monitor within 1 week.   Helpful Tips for Accurate Home Blood Pressure Checks  Don't smoke, exercise, or drink caffeine 30 minutes before checking your BP Use the restroom before checking your BP (a full bladder can raise your pressure) Relax in a comfortable upright chair Feet on the ground Left arm resting comfortably on a flat surface at the level of your heart Legs uncrossed Back supported Sit quietly and don't talk Place the cuff on your bare arm Adjust snuggly, so that only two fingertips can fit between your skin and the top of the cuff Check 2 readings separated by at least one minute Keep a log of your BP readings For a visual, please reference this diagram: http://ccnc.care/bpdiagram  Provider Name: [facility]  Phone: [phone]  Zone 1: ALL CLEAR  Continue to monitor your symptoms:  BP reading is less than 140 (top number) or less than 90 (bottom number)  No right upper stomach pain No headaches or seeing spots No feeling nauseated or throwing up No swelling in face and hands  Zone 2: CAUTION Call your doctor's office for any of the following:  BP reading is greater than 140 (top number) or greater than 90 (bottom number)  Stomach pain under your ribs in the middle or right side Headaches or seeing  spots Feeling nauseated or throwing up Swelling in face and hands  Zone 3: EMERGENCY  Seek immediate medical care if you have any of the following:  BP reading is greater than160 (top number) or greater than 110 (bottom number) Severe headaches not improving with Tylenol Serious difficulty catching your breath Any worsening symptoms from Zone 2    First Trimester of Pregnancy The first trimester of pregnancy is from week 1 until the end of week 12 (months 1 through 3). A week after a sperm fertilizes an egg, the egg will implant on the wall of the uterus. This embryo will begin to develop into a baby. Genes from you and your partner are forming the baby. The female genes determine whether the baby is a boy or a girl. At 6-8 weeks, the eyes and face are formed, and the heartbeat can be seen on ultrasound. At the end of 12 weeks, all the baby's organs are formed.  Now that you are pregnant, you will want to do everything you can to have a healthy baby. Two of the most important things are to get good prenatal care and to follow your health care provider's instructions. Prenatal care is all the medical care you receive before the baby's birth. This care will help prevent, find, and treat any problems during the pregnancy and childbirth. BODY CHANGES Your body goes through many changes during pregnancy. The changes vary from woman to woman.  You may gain or lose a couple of pounds at first. You may feel sick to your stomach (nauseous) and throw up (vomit). If the vomiting is uncontrollable, call your health care provider. You may tire easily. You may develop headaches that can be relieved by medicines approved by your health care provider. You may urinate more often. Painful urination may mean you have a bladder infection. You may develop heartburn as a result of your pregnancy. You may develop constipation because certain hormones are causing the muscles that push waste through your intestines to  slow down. You may develop hemorrhoids or swollen, bulging veins (varicose veins). Your breasts may begin to grow larger and become tender. Your nipples may stick out more, and the tissue that surrounds them (areola) may become darker. Your gums may bleed and may be sensitive to brushing and flossing. Dark spots or blotches (chloasma, mask of pregnancy) may develop on your face. This will likely fade after the baby is born. Your menstrual periods will stop. You may have a loss of appetite. You may develop cravings for certain kinds of food. You may have changes in your emotions from day to day, such as being excited to be pregnant or being concerned that something may go wrong with the pregnancy and baby. You may have more vivid and strange dreams. You may have changes in your hair. These can include thickening of your hair, rapid growth, and changes in texture. Some women also have hair loss during or after  pregnancy, or hair that feels dry or thin. Your hair will most likely return to normal after your baby is born. WHAT TO EXPECT AT YOUR PRENATAL VISITS During a routine prenatal visit: You will be weighed to make sure you and the baby are growing normally. Your blood pressure will be taken. Your abdomen will be measured to track your baby's growth. The fetal heartbeat will be listened to starting around week 10 or 12 of your pregnancy. Test results from any previous visits will be discussed. Your health care provider may ask you: How you are feeling. If you are feeling the baby move. If you have had any abnormal symptoms, such as leaking fluid, bleeding, severe headaches, or abdominal cramping. If you have any questions. Other tests that may be performed during your first trimester include: Blood tests to find your blood type and to check for the presence of any previous infections. They will also be used to check for low iron levels (anemia) and Rh antibodies. Later in the pregnancy,  blood tests for diabetes will be done along with other tests if problems develop. Urine tests to check for infections, diabetes, or protein in the urine. An ultrasound to confirm the proper growth and development of the baby. An amniocentesis to check for possible genetic problems. Fetal screens for spina bifida and Down syndrome. You may need other tests to make sure you and the baby are doing well. HOME CARE INSTRUCTIONS  Medicines Follow your health care provider's instructions regarding medicine use. Specific medicines may be either safe or unsafe to take during pregnancy. Take your prenatal vitamins as directed. If you develop constipation, try taking a stool softener if your health care provider approves. Diet Eat regular, well-balanced meals. Choose a variety of foods, such as meat or vegetable-based protein, fish, milk and low-fat dairy products, vegetables, fruits, and whole grain breads and cereals. Your health care provider will help you determine the amount of weight gain that is right for you. Avoid raw meat and uncooked cheese. These carry germs that can cause birth defects in the baby. Eating four or five small meals rather than three large meals a day may help relieve nausea and vomiting. If you start to feel nauseous, eating a few soda crackers can be helpful. Drinking liquids between meals instead of during meals also seems to help nausea and vomiting. If you develop constipation, eat more high-fiber foods, such as fresh vegetables or fruit and whole grains. Drink enough fluids to keep your urine clear or pale yellow. Activity and Exercise Exercise only as directed by your health care provider. Exercising will help you: Control your weight. Stay in shape. Be prepared for labor and delivery. Experiencing pain or cramping in the lower abdomen or low back is a good sign that you should stop exercising. Check with your health care provider before continuing normal exercises. Try  to avoid standing for long periods of time. Move your legs often if you must stand in one place for a long time. Avoid heavy lifting. Wear low-heeled shoes, and practice good posture. You may continue to have sex unless your health care provider directs you otherwise. Relief of Pain or Discomfort Wear a good support bra for breast tenderness.   Take warm sitz baths to soothe any pain or discomfort caused by hemorrhoids. Use hemorrhoid cream if your health care provider approves.   Rest with your legs elevated if you have leg cramps or low back pain. If you develop varicose veins in your  legs, wear support hose. Elevate your feet for 15 minutes, 3-4 times a day. Limit salt in your diet. Prenatal Care Schedule your prenatal visits by the twelfth week of pregnancy. They are usually scheduled monthly at first, then more often in the last 2 months before delivery. Write down your questions. Take them to your prenatal visits. Keep all your prenatal visits as directed by your health care provider. Safety Wear your seat belt at all times when driving. Make a list of emergency phone numbers, including numbers for family, friends, the hospital, and police and fire departments. General Tips Ask your health care provider for a referral to a local prenatal education class. Begin classes no later than at the beginning of month 6 of your pregnancy. Ask for help if you have counseling or nutritional needs during pregnancy. Your health care provider can offer advice or refer you to specialists for help with various needs. Do not use hot tubs, steam rooms, or saunas. Do not douche or use tampons or scented sanitary pads. Do not cross your legs for long periods of time. Avoid cat litter boxes and soil used by cats. These carry germs that can cause birth defects in the baby and possibly loss of the fetus by miscarriage or stillbirth. Avoid all smoking, herbs, alcohol, and medicines not prescribed by your health  care provider. Chemicals in these affect the formation and growth of the baby. Schedule a dentist appointment. At home, brush your teeth with a soft toothbrush and be gentle when you floss. SEEK MEDICAL CARE IF:  You have dizziness. You have mild pelvic cramps, pelvic pressure, or nagging pain in the abdominal area. You have persistent nausea, vomiting, or diarrhea. You have a bad smelling vaginal discharge. You have pain with urination. You notice increased swelling in your face, hands, legs, or ankles. SEEK IMMEDIATE MEDICAL CARE IF:  You have a fever. You are leaking fluid from your vagina. You have spotting or bleeding from your vagina. You have severe abdominal cramping or pain. You have rapid weight gain or loss. You vomit blood or material that looks like coffee grounds. You are exposed to [place] measles and have never had them. You are exposed to fifth disease or chickenpox. You develop a severe headache. You have shortness of breath. You have any kind of trauma, such as from a fall or a car accident. Document Released: 08/08/2001 Document Revised: 12/29/2013 Document Reviewed: 06/24/2013 [facility] Patient Information 2015 [place], [place]. This information is not intended to replace advice given to you by your health care provider. Make sure you discuss any questions you have with your health care provider.  ADDITIONAL HEALTHCARE OPTIONS FOR PATIENTS  [place] [facility] / e-Visit: [email or web address]         [facility]: [phone]  [facility]: [phone]                   [facility]: [phone]     Safe Medications in Pregnancy   Acne: Benzoyl Peroxide Salicylic Acid  Backache/Headache: Tylenol: 2 regular strength every 4 hours OR              2 Extra strength every 6 hours  Colds/Coughs/Allergies: Benadryl (alcohol free) 25 mg every 6 hours as needed Breath right  strips Claritin Cepacol throat lozenges Chloraseptic throat spray Cold-Eeze- up to three times per day Cough drops, alcohol free Flonase (by prescription only) Guaifenesin Mucinex Robitussin DM (plain only, alcohol free) Saline nasal spray/drops Sudafed (  pseudoephedrine) & Actifed ** use only after [redacted] weeks gestation and if you do not have high blood pressure Tylenol Vicks Vaporub Zinc lozenges Zyrtec   Constipation: Colace Ducolax suppositories Fleet enema Glycerin suppositories Metamucil Milk of magnesia Miralax Senokot Smooth move tea  Diarrhea: Kaopectate Imodium A-D  *NO pepto Bismol  Hemorrhoids: Anusol Anusol HC Preparation H Tucks  Indigestion: Tums Maalox Mylanta Zantac  Pepcid  Insomnia: Benadryl (alcohol free) 25mg  every 6 hours as needed Tylenol PM Unisom, no Gelcaps  Leg Cramps: Tums MagGel  Nausea/Vomiting:  Bonine Dramamine Emetrol Ginger extract Sea bands Meclizine  Nausea medication to take during pregnancy:  Unisom (doxylamine succinate 25 mg tablets) Take one tablet daily at bedtime. If symptoms are not adequately controlled, the dose can be increased to a maximum recommended dose of two tablets daily (1/2 tablet in the morning, 1/2 tablet mid-afternoon and one at bedtime). Vitamin B6 100mg  tablets. Take one tablet twice a day (up to 200 mg per day).  Skin Rashes: Aveeno products Benadryl cream or 25mg  every 6 hours as needed Calamine Lotion 1% cortisone cream  Yeast infection: Gyne-lotrimin 7 Monistat 7   **If taking multiple medications, please check labels to avoid duplicating the same active ingredients **take medication as directed on the label ** Do not exceed 4000 mg of tylenol in 24 hours **Do not take medications that contain aspirin or ibuprofen

## 2023-02-09 LAB — CYTOLOGY - PAP
Adequacy: ABSENT
Chlamydia: NEGATIVE
Comment: NEGATIVE
Comment: NEGATIVE
Comment: NORMAL
Diagnosis: NEGATIVE
High risk HPV: NEGATIVE
Neisseria Gonorrhea: NEGATIVE

## 2023-02-10 LAB — URINE CULTURE

## 2023-02-12 ENCOUNTER — Other Ambulatory Visit: Payer: Self-pay | Admitting: *Deleted

## 2023-02-12 DIAGNOSIS — O09291 Supervision of pregnancy with other poor reproductive or obstetric history, first trimester: Secondary | ICD-10-CM

## 2023-02-12 LAB — CBC/D/PLT+RPR+RH+ABO+RUBIGG...
Antibody Screen: NEGATIVE
Basophils Absolute: 0 10*3/uL (ref 0.0–0.2)
Basos: 0 %
EOS (ABSOLUTE): 0 10*3/uL (ref 0.0–0.4)
Eos: 1 %
HCV Ab: NONREACTIVE
HIV Screen 4th Generation wRfx: NONREACTIVE
Hematocrit: 35.2 % (ref 34.0–46.6)
Hemoglobin: 12.1 g/dL (ref 11.1–15.9)
Hepatitis B Surface Ag: NEGATIVE
Immature Grans (Abs): 0 10*3/uL (ref 0.0–0.1)
Immature Granulocytes: 0 %
Lymphocytes Absolute: 1.8 10*3/uL (ref 0.7–3.1)
Lymphs: 35 %
MCH: 29.7 pg (ref 26.6–33.0)
MCHC: 34.4 g/dL (ref 31.5–35.7)
MCV: 86 fL (ref 79–97)
Monocytes Absolute: 0.5 10*3/uL (ref 0.1–0.9)
Monocytes: 10 %
Neutrophils Absolute: 2.8 10*3/uL (ref 1.4–7.0)
Neutrophils: 54 %
Platelets: 254 10*3/uL (ref 150–450)
RBC: 4.08 x10E6/uL (ref 3.77–5.28)
RDW: 12.5 % (ref 11.7–15.4)
RPR Ser Ql: NONREACTIVE
Rubella Antibodies, IGG: 2.4 index (ref >0.99)
WBC: 5.3 10*3/uL (ref 3.4–10.8)

## 2023-02-12 LAB — HGB FRACTIONATION CASCADE
Hgb A2: 2.4 % (ref 1.8–3.2)
Hgb A: 97.6 % (ref 96.4–98.8)
Hgb F: 0 % (ref 0.0–2.0)
Hgb S: 0 %

## 2023-02-12 LAB — HCV INTERPRETATION

## 2023-02-18 LAB — PANORAMA PRENATAL TEST FULL PANEL:PANORAMA TEST PLUS 5 ADDITIONAL MICRODELETIONS: FETAL FRACTION: 9.4

## 2023-02-21 DIAGNOSIS — O09899 Supervision of other high risk pregnancies, unspecified trimester: Secondary | ICD-10-CM | POA: Insufficient documentation

## 2023-02-27 ENCOUNTER — Ambulatory Visit: Payer: 59

## 2023-02-27 DIAGNOSIS — O09899 Supervision of other high risk pregnancies, unspecified trimester: Secondary | ICD-10-CM

## 2023-03-07 ENCOUNTER — Encounter: Payer: Self-pay | Admitting: *Deleted

## 2023-03-08 ENCOUNTER — Ambulatory Visit: Payer: 59 | Admitting: *Deleted

## 2023-03-08 ENCOUNTER — Other Ambulatory Visit: Payer: Self-pay | Admitting: Advanced Practice Midwife

## 2023-03-08 ENCOUNTER — Ambulatory Visit: Payer: 59 | Attending: Advanced Practice Midwife

## 2023-03-08 ENCOUNTER — Encounter: Payer: Self-pay | Admitting: Advanced Practice Midwife

## 2023-03-08 VITALS — BP 108/52 | HR 74

## 2023-03-08 DIAGNOSIS — O09291 Supervision of pregnancy with other poor reproductive or obstetric history, first trimester: Secondary | ICD-10-CM | POA: Diagnosis not present

## 2023-03-08 DIAGNOSIS — O26872 Cervical shortening, second trimester: Secondary | ICD-10-CM | POA: Diagnosis not present

## 2023-03-08 DIAGNOSIS — Z3A16 16 weeks gestation of pregnancy: Secondary | ICD-10-CM | POA: Diagnosis not present

## 2023-03-08 DIAGNOSIS — O09899 Supervision of other high risk pregnancies, unspecified trimester: Secondary | ICD-10-CM

## 2023-03-08 DIAGNOSIS — O09212 Supervision of pregnancy with history of pre-term labor, second trimester: Secondary | ICD-10-CM | POA: Diagnosis not present

## 2023-03-09 ENCOUNTER — Other Ambulatory Visit: Payer: Self-pay | Admitting: *Deleted

## 2023-03-09 DIAGNOSIS — O09899 Supervision of other high risk pregnancies, unspecified trimester: Secondary | ICD-10-CM

## 2023-03-09 DIAGNOSIS — O09299 Supervision of pregnancy with other poor reproductive or obstetric history, unspecified trimester: Secondary | ICD-10-CM

## 2023-03-15 ENCOUNTER — Encounter: Payer: Self-pay | Admitting: Advanced Practice Midwife

## 2023-03-15 DIAGNOSIS — Z803 Family history of malignant neoplasm of breast: Secondary | ICD-10-CM | POA: Insufficient documentation

## 2023-03-16 ENCOUNTER — Telehealth: Payer: Self-pay

## 2023-03-16 NOTE — Telephone Encounter (Signed)
Called patient about unread mychart message that I had sent about her fmla papers being ready and I had made her some provider appointments. Left voicemail.

## 2023-03-20 ENCOUNTER — Other Ambulatory Visit: Payer: Self-pay | Admitting: Obstetrics and Gynecology

## 2023-03-20 ENCOUNTER — Ambulatory Visit: Payer: 59 | Attending: Obstetrics and Gynecology

## 2023-03-20 ENCOUNTER — Ambulatory Visit: Payer: 59 | Admitting: *Deleted

## 2023-03-20 VITALS — BP 115/52 | HR 77

## 2023-03-20 DIAGNOSIS — O26872 Cervical shortening, second trimester: Secondary | ICD-10-CM | POA: Diagnosis not present

## 2023-03-20 DIAGNOSIS — O09899 Supervision of other high risk pregnancies, unspecified trimester: Secondary | ICD-10-CM

## 2023-03-20 DIAGNOSIS — O09299 Supervision of pregnancy with other poor reproductive or obstetric history, unspecified trimester: Secondary | ICD-10-CM

## 2023-03-20 DIAGNOSIS — Z3A17 17 weeks gestation of pregnancy: Secondary | ICD-10-CM

## 2023-03-20 DIAGNOSIS — Z3480 Encounter for supervision of other normal pregnancy, unspecified trimester: Secondary | ICD-10-CM

## 2023-03-20 DIAGNOSIS — O09292 Supervision of pregnancy with other poor reproductive or obstetric history, second trimester: Secondary | ICD-10-CM | POA: Diagnosis not present

## 2023-03-20 DIAGNOSIS — O09212 Supervision of pregnancy with history of pre-term labor, second trimester: Secondary | ICD-10-CM | POA: Insufficient documentation

## 2023-03-21 ENCOUNTER — Encounter: Payer: Self-pay | Admitting: Obstetrics & Gynecology

## 2023-03-21 ENCOUNTER — Encounter: Payer: Self-pay | Admitting: *Deleted

## 2023-03-21 ENCOUNTER — Ambulatory Visit (INDEPENDENT_AMBULATORY_CARE_PROVIDER_SITE_OTHER): Payer: 59 | Admitting: Obstetrics & Gynecology

## 2023-03-21 VITALS — BP 128/78 | HR 82 | Wt 135.4 lb

## 2023-03-21 DIAGNOSIS — O09291 Supervision of pregnancy with other poor reproductive or obstetric history, first trimester: Secondary | ICD-10-CM

## 2023-03-21 DIAGNOSIS — Z3480 Encounter for supervision of other normal pregnancy, unspecified trimester: Secondary | ICD-10-CM

## 2023-03-21 DIAGNOSIS — Z98891 History of uterine scar from previous surgery: Secondary | ICD-10-CM

## 2023-03-21 DIAGNOSIS — Z1379 Encounter for other screening for genetic and chromosomal anomalies: Secondary | ICD-10-CM

## 2023-03-21 DIAGNOSIS — Z3A17 17 weeks gestation of pregnancy: Secondary | ICD-10-CM

## 2023-03-21 DIAGNOSIS — O09292 Supervision of pregnancy with other poor reproductive or obstetric history, second trimester: Secondary | ICD-10-CM

## 2023-03-21 NOTE — Progress Notes (Signed)
HIGH-RISK PREGNANCY VISIT Patient name: April Shah MRN [id number]  Date of birth: 10/22/1995 Chief Complaint:   Routine Prenatal Visit  History of Present Illness:   April Shah is a 27 y.o. G79P0101 female at [redacted]w[redacted]d with an Estimated Date of Delivery: 08/23/23 being seen today for ongoing management of a high-risk pregnancy complicated by:  -h/o short cervix and PTD- on progesterone, followed by serial cervical lengths  -Prior C-section -Rh neg/"weak"  Today she reports no complaints.   Contractions: Not present. Vag. Bleeding: None.  Movement: Present. denies leaking of fluid.      02/08/2023   11:05 AM  Depression screen PHQ 2/9  Decreased Interest 3  Down, Depressed, Hopeless 3  PHQ - 2 Score 6  Altered sleeping 2  Tired, decreased energy 3  Change in appetite 1  Feeling bad or failure about yourself  3  Trouble concentrating 0  Moving slowly or fidgety/restless 0  Suicidal thoughts 0  PHQ-9 Score 15     Current Outpatient Medications  Medication Instructions   acyclovir ointment (ZOVIRAX) 5 % 1 application , Topical, Every  3 hours   Prenatal Vit-Fe Fumarate-FA (PRENATAL VITAMIN PO) Oral   progesterone (PROMETRIUM) 200 mg, Vaginal, Daily   valACYclovir (VALTREX) 500 mg, 2 times daily     Review of Systems:   Pertinent items are noted in HPI Denies abnormal vaginal discharge w/ itching/odor/irritation, headaches, visual changes, shortness of breath, chest pain, abdominal pain, severe nausea/vomiting, or problems with urination or bowel movements unless otherwise stated above. Pertinent History Reviewed:  Reviewed past medical,surgical, social, obstetrical and family history.  Reviewed problem list, medications and allergies. Physical Assessment:   Vitals:   03/21/23 1456  BP: 128/78  Pulse: 82  Weight: 135 lb 6.4 oz (61.4 kg)  Body mass index is 25.58 kg/m.           Physical Examination:   General appearance: alert, well appearing, and in no  distress  Mental status: normal mood, behavior, speech, dress, motor activity, and thought processes  Skin: warm & dry   Extremities: Edema: None    Cardiovascular: normal heart rate noted  Respiratory: normal respiratory effort, no distress  Abdomen: gravid, soft, non-tender  Pelvic: Cervical exam deferred         Fetal Status: Fetal Heart Rate (bpm): 135   Movement: Present    Fetal Surveillance Testing today: doppler US completed with MFM yesterday 7/23- normal cervical length 3.2cm  Chaperone: N/A    No results found for this or any previous visit (from the past 24 hour(s)).   Assessment & Plan:  High-risk pregnancy: G2P0101 at [redacted]w[redacted]d with an Estimated Date of Delivery: 08/23/23   1) h/o cervical shortening -recent US with normal cervical length -anatomy scan with CL scheduled  Prior C-section RH neg  -Continue routine OB care  Meds: No orders of the defined types were placed in this encounter.   Labs/procedures today: anatomy scan next, []  pt may come in for lab work for AFP  Treatment Plan:  routine OB care, anatomy scan scheduled  Reviewed: Preterm labor symptoms and general obstetric precautions including but not limited to vaginal bleeding, contractions, leaking of fluid and fetal movement were reviewed in detail with the patient.  All questions were answered.  Follow-up: Return in about 4 weeks (around 04/18/2023) for follow up as scheduled next appt 8/23, HROB visit.   Future Appointments  Date Time Provider Department Center  04/05/2023  7:30 AM [facility]URSE [facility]  04/05/2023  7:45 AM [facility] US4 [facility]  04/05/2023  2:50 PM Myna Hidalgo, DO [facility]  04/19/2023  9:15 AM [facility] NURSE [facility]  04/19/2023  9:30 AM [facility] US3 [facility]  04/20/2023  8:50 AM Myna Hidalgo, DO [facility]    No orders of the defined types were placed in this encounter.   Myna Hidalgo, DO Attending Obstetrician & Gynecologist, [facility] for [employer], [facility]

## 2023-03-26 ENCOUNTER — Other Ambulatory Visit: Payer: 59

## 2023-03-26 ENCOUNTER — Encounter: Payer: Self-pay | Admitting: Obstetrics & Gynecology

## 2023-03-26 DIAGNOSIS — Z1379 Encounter for other screening for genetic and chromosomal anomalies: Secondary | ICD-10-CM | POA: Diagnosis not present

## 2023-03-29 ENCOUNTER — Encounter: Payer: 59 | Admitting: Advanced Practice Midwife

## 2023-03-29 ENCOUNTER — Other Ambulatory Visit: Payer: 59

## 2023-04-02 DIAGNOSIS — Z3482 Encounter for supervision of other normal pregnancy, second trimester: Secondary | ICD-10-CM | POA: Diagnosis not present

## 2023-04-02 DIAGNOSIS — Z3483 Encounter for supervision of other normal pregnancy, third trimester: Secondary | ICD-10-CM | POA: Diagnosis not present

## 2023-04-05 ENCOUNTER — Ambulatory Visit: Payer: 59

## 2023-04-05 ENCOUNTER — Encounter: Payer: 59 | Admitting: Obstetrics & Gynecology

## 2023-04-06 ENCOUNTER — Encounter: Payer: 59 | Admitting: Obstetrics & Gynecology

## 2023-04-10 ENCOUNTER — Encounter: Payer: 59 | Admitting: Obstetrics & Gynecology

## 2023-04-18 ENCOUNTER — Ambulatory Visit: Payer: 59

## 2023-04-19 ENCOUNTER — Ambulatory Visit: Payer: 59

## 2023-04-20 ENCOUNTER — Encounter: Payer: Self-pay | Admitting: Obstetrics & Gynecology

## 2023-04-20 ENCOUNTER — Ambulatory Visit (INDEPENDENT_AMBULATORY_CARE_PROVIDER_SITE_OTHER): Payer: 59 | Admitting: Obstetrics & Gynecology

## 2023-04-20 VITALS — BP 110/66 | HR 90 | Wt 143.8 lb

## 2023-04-20 DIAGNOSIS — O09292 Supervision of pregnancy with other poor reproductive or obstetric history, second trimester: Secondary | ICD-10-CM

## 2023-04-20 DIAGNOSIS — Z3A22 22 weeks gestation of pregnancy: Secondary | ICD-10-CM

## 2023-04-20 DIAGNOSIS — Z98891 History of uterine scar from previous surgery: Secondary | ICD-10-CM

## 2023-04-20 DIAGNOSIS — O09892 Supervision of other high risk pregnancies, second trimester: Secondary | ICD-10-CM

## 2023-04-20 DIAGNOSIS — O09899 Supervision of other high risk pregnancies, unspecified trimester: Secondary | ICD-10-CM

## 2023-04-20 NOTE — Progress Notes (Signed)
HIGH-RISK PREGNANCY VISIT Patient name: April Shah MRN [id number]  Date of birth: 03-Apr-1996 Chief Complaint:   Routine Prenatal Visit  History of Present Illness:   SHAKEITA Shah is a 27 y.o. G53P0101 female at [redacted]w[redacted]d with an Estimated Date of Delivery: 08/23/23 being seen today for ongoing management of a high-risk pregnancy complicated by :  -h/o short cervix Pt was unable to make [place] appt today (car trouble) -Rh neg (weak D) -HSV -Prior C-section  Today she reports no complaints.   Contractions: Not present. Vag. Bleeding: None.  Movement: Present. denies leaking of fluid.      02/08/2023   11:05 AM  Depression screen PHQ 2/9  Decreased Interest 3  Down, Depressed, Hopeless 3  PHQ - 2 Score 6  Altered sleeping 2  Tired, decreased energy 3  Change in appetite 1  Feeling bad or failure about yourself  3  Trouble concentrating 0  Moving slowly or fidgety/restless 0  Suicidal thoughts 0  PHQ-9 Score 15     Current Outpatient Medications  Medication Instructions   acyclovir ointment (ZOVIRAX) 5 % 1 application , Topical, Every  3 hours   Prenatal Vit-Fe Fumarate-FA (PRENATAL VITAMIN PO) Oral   progesterone (PROMETRIUM) 200 mg, Vaginal, Daily   valACYclovir (VALTREX) 500 mg, Oral, 2 times daily     Review of Systems:   Pertinent items are noted in HPI Denies abnormal vaginal discharge w/ itching/odor/irritation, headaches, visual changes, shortness of breath, chest pain, abdominal pain, severe nausea/vomiting, or problems with urination or bowel movements unless otherwise stated above. Pertinent History Reviewed:  Reviewed past medical,surgical, social, obstetrical and family history.  Reviewed problem list, medications and allergies. Physical Assessment:   Vitals:   04/20/23 0904  BP: 110/66  Pulse: 90  Weight: 143 lb 12.8 oz (65.2 kg)  Body mass index is 27.17 kg/m.           Physical Examination:   General appearance: alert, well appearing, and in no  distress  Mental status: normal mood, behavior, speech, dress, motor activity, and thought processes  Skin: warm & dry   Extremities: Edema: None    Cardiovascular: normal heart rate noted  Respiratory: normal respiratory effort, no distress  Abdomen: gravid, soft, non-tender  Pelvic:  multiparous cervix- appears closed with good cervical length.  On SVE; cervix closed/long/-3   Dilation: Closed      Fetal Status: Fetal Heart Rate (bpm): 145 Fundal Height: 22 cm Movement: Present    Fetal Surveillance Testing today: doppler   Chaperone:  pt declined     No results found for this or any previous visit (from the past 24 hour(s)).   Assessment & Plan:  High-risk pregnancy: G2P0101 at [redacted]w[redacted]d with an Estimated Date of Delivery: 08/23/23   1) h/o short cervix with PTD Continue vaginal progesterone Follow up as scheduled for [place] on 9/3  2) HSV 3) Rh neg 4) Prior C-section  Meds: No orders of the defined types were placed in this encounter.   Labs/procedures today: SSE  Treatment Plan:  routine OB care and as outlined above, PN2 next visit  Reviewed: Preterm labor symptoms and general obstetric precautions including but not limited to vaginal bleeding, contractions, leaking of fluid and fetal movement were reviewed in detail with the patient.  All questions were answered.  Follow-up: Return in about 4 weeks (around 05/18/2023) for HROB visit ([facility] CNM) and PN-2.   Future Appointments  Date Time Provider Department Center  05/01/2023  9:30 AM  [facility] NURSE [facility]  05/01/2023  9:45 AM [facility] US6 [facility]    No orders of the defined types were placed in this encounter.   April Hidalgo, DO Attending Obstetrician & Gynecologist, [facility] for [employer], [facility]

## 2023-04-24 ENCOUNTER — Encounter: Payer: Self-pay | Admitting: Obstetrics & Gynecology

## 2023-05-01 ENCOUNTER — Ambulatory Visit: Payer: 59 | Attending: Obstetrics | Admitting: Obstetrics

## 2023-05-01 ENCOUNTER — Other Ambulatory Visit: Payer: Self-pay | Admitting: *Deleted

## 2023-05-01 ENCOUNTER — Ambulatory Visit: Payer: 59 | Attending: Obstetrics and Gynecology

## 2023-05-01 ENCOUNTER — Ambulatory Visit: Payer: 59 | Admitting: *Deleted

## 2023-05-01 VITALS — BP 111/64 | HR 68

## 2023-05-01 DIAGNOSIS — O09299 Supervision of pregnancy with other poor reproductive or obstetric history, unspecified trimester: Secondary | ICD-10-CM | POA: Diagnosis present

## 2023-05-01 DIAGNOSIS — O09212 Supervision of pregnancy with history of pre-term labor, second trimester: Secondary | ICD-10-CM

## 2023-05-01 DIAGNOSIS — O09292 Supervision of pregnancy with other poor reproductive or obstetric history, second trimester: Secondary | ICD-10-CM | POA: Insufficient documentation

## 2023-05-01 DIAGNOSIS — Z362 Encounter for other antenatal screening follow-up: Secondary | ICD-10-CM

## 2023-05-01 DIAGNOSIS — Z3A23 23 weeks gestation of pregnancy: Secondary | ICD-10-CM | POA: Insufficient documentation

## 2023-05-01 DIAGNOSIS — Z363 Encounter for antenatal screening for malformations: Secondary | ICD-10-CM | POA: Insufficient documentation

## 2023-05-01 DIAGNOSIS — Z3480 Encounter for supervision of other normal pregnancy, unspecified trimester: Secondary | ICD-10-CM

## 2023-05-01 DIAGNOSIS — O09899 Supervision of other high risk pregnancies, unspecified trimester: Secondary | ICD-10-CM

## 2023-05-01 DIAGNOSIS — O26872 Cervical shortening, second trimester: Secondary | ICD-10-CM

## 2023-05-01 DIAGNOSIS — O34219 Maternal care for unspecified type scar from previous cesarean delivery: Secondary | ICD-10-CM | POA: Insufficient documentation

## 2023-05-01 DIAGNOSIS — O360120 Maternal care for anti-D [Rh] antibodies, second trimester, not applicable or unspecified: Secondary | ICD-10-CM | POA: Diagnosis not present

## 2023-05-01 DIAGNOSIS — O3430 Maternal care for cervical incompetence, unspecified trimester: Secondary | ICD-10-CM

## 2023-05-01 NOTE — Progress Notes (Signed)
MFM Note  April Shah was seen for a detailed fetal anatomy scan and cervical length measurement.  She reports that she was followed for a shortened cervix during her last pregnancy 9 years ago and delivered at 35 weeks and 6 days via C-section because of a breech presentation.  She reports history of a prior cone biopsy.    Due to her history, she is currently treated with daily vaginal progesterone (Prometrium 200 mg daily). She denies any problems in her current pregnancy.    She had a cell free DNA test earlier in her pregnancy which indicated a low risk for trisomy 21, 44, and 13. A female fetus is predicted.   She was informed that the fetal growth and amniotic fluid level were appropriate for her gestational age.   There were no obvious fetal anomalies noted on today's ultrasound exam.  However, today's exam was limited due to the fetal position.  The patient was informed that anomalies may be missed due to technical limitations. If the fetus is in a suboptimal position or maternal habitus is increased, visualization of the fetus in the maternal uterus may be impaired.  A transvaginal ultrasound performed today shows a cervical length of 2.3 cm long without any signs of funneling.    The increased risk of preterm birth due to a sonographically detected shortened cervix was discussed.    She was also advised that her cervical length may be shorter due to her prior cone biopsy.    She was advised to continue treatment with daily vaginal progesterone until 36 weeks.    We will continue to follow her with growth ultrasounds throughout her pregnancy.    A follow-up exam was scheduled in 4 weeks.    The patient stated that all of her questions were answered today.  A total of 20 minutes was spent counseling and coordinating the care for this patient.  Greater than 50% of the time was spent in direct face-to-face contact.

## 2023-05-17 ENCOUNTER — Encounter: Payer: Self-pay | Admitting: Advanced Practice Midwife

## 2023-05-17 ENCOUNTER — Other Ambulatory Visit: Payer: 59

## 2023-05-17 ENCOUNTER — Ambulatory Visit (INDEPENDENT_AMBULATORY_CARE_PROVIDER_SITE_OTHER): Payer: 59 | Admitting: Advanced Practice Midwife

## 2023-05-17 VITALS — BP 107/66 | HR 72 | Wt 153.4 lb

## 2023-05-17 DIAGNOSIS — Z23 Encounter for immunization: Secondary | ICD-10-CM | POA: Diagnosis not present

## 2023-05-17 DIAGNOSIS — Z131 Encounter for screening for diabetes mellitus: Secondary | ICD-10-CM

## 2023-05-17 DIAGNOSIS — O09899 Supervision of other high risk pregnancies, unspecified trimester: Secondary | ICD-10-CM

## 2023-05-17 DIAGNOSIS — Z3480 Encounter for supervision of other normal pregnancy, unspecified trimester: Secondary | ICD-10-CM | POA: Diagnosis not present

## 2023-05-17 DIAGNOSIS — Z98891 History of uterine scar from previous surgery: Secondary | ICD-10-CM

## 2023-05-17 DIAGNOSIS — Z3A25 25 weeks gestation of pregnancy: Secondary | ICD-10-CM | POA: Diagnosis not present

## 2023-05-17 DIAGNOSIS — N883 Incompetence of cervix uteri: Secondary | ICD-10-CM

## 2023-05-17 DIAGNOSIS — Z3A26 26 weeks gestation of pregnancy: Secondary | ICD-10-CM

## 2023-05-17 DIAGNOSIS — B009 Herpesviral infection, unspecified: Secondary | ICD-10-CM

## 2023-05-17 NOTE — Progress Notes (Signed)
HIGH-RISK PREGNANCY VISIT Patient name: April Shah MRN [id number]  Date of birth: [birth year] Chief Complaint:   Routine Prenatal Visit (PN2 [place] today)  History of Present Illness:   April Shah is a 27 y.o. G93P0101 female at [redacted]w[redacted]d with an Estimated Date of Delivery: 08/23/23 being seen today for ongoing management of a high-risk pregnancy complicated by short cervix.  Using prometrium nightly  Today she reports no complaints. Contractions: Irritability.  .  Movement: Present. denies leaking of fluid.      02/08/2023   11:05 AM  Depression screen PHQ 2/9  Decreased Interest 3  Down, Depressed, Hopeless 3  PHQ - 2 Score 6  Altered sleeping 2  Tired, decreased energy 3  Change in appetite 1  Feeling bad or failure about yourself  3  Trouble concentrating 0  Moving slowly or fidgety/restless 0  Suicidal thoughts 0  PHQ-9 Score 15        02/08/2023   11:05 AM  GAD 7 : Generalized Anxiety Score  Nervous, Anxious, on Edge 2  Control/stop worrying 0  Worry too much - different things 2  Trouble relaxing 3  Restless 0  Easily annoyed or irritable 3  Afraid - awful might happen 3  Total GAD 7 Score 13     Review of Systems:   Pertinent items are noted in HPI Denies abnormal vaginal discharge w/ itching/odor/irritation, headaches, visual changes, shortness of breath, chest pain, abdominal pain, severe nausea/vomiting, or problems with urination or bowel movements unless otherwise stated above. Pertinent History Reviewed:  Reviewed past medical,surgical, social, obstetrical and family history.  Reviewed problem list, medications and allergies. Physical Assessment:   Vitals:   05/17/23 0843  BP: 107/66  Pulse: 72  Weight: 153 lb 6.4 oz (69.6 kg)  Body mass index is 28.98 kg/m.           Physical Examination:   General appearance: alert, well appearing, and in no distress  Mental status: alert, oriented to person, place, and time  Skin: warm & dry    Extremities: Edema: Trace    Cardiovascular: normal heart rate noted  Respiratory: normal respiratory effort, no distress  Abdomen: gravid, soft, non-tender  Pelvic: Cervical exam deferred         Chaperone: N/A    Fetal Status: Fetal Heart Rate (bpm): 148 Fundal Height: 25 cm Movement: Present    Fetal Surveillance Testing today: doppler     No results found for this or any previous visit (from the past 24 hour(s)).  Assessment & Plan:  High-risk pregnancy: G2P0101 at [redacted]w[redacted]d with an Estimated Date of Delivery: 08/23/23   1. [redacted] weeks gestation of pregnancy   2. Herpes simplex PPX at 34 weeks  3. History of cesarean delivery Plans TOLAC  4. History of preterm delivery, currently pregnant  - US OB Follow Up; Standing  5. Supervision of other high risk pregnancy, antepartum  - US OB Follow Up; Standing  MFM planned on doing monthly growth [place] (told to use dx code "short cervix")-pt prefers to get them here, so will schedule  Meds: No orders of the defined types were placed in this encounter.   Orders:  Orders Placed This Encounter  Procedures   US OB Follow Up   Tdap vaccine greater than or equal to 7yo IM     Labs/procedures today: PN2   Reviewed: Preterm labor symptoms and general obstetric precautions including but not limited to vaginal bleeding, contractions, leaking of fluid and  fetal movement were reviewed in detail with the patient.  All questions were answered. Does have home bp cuff. Office bp cuff given: not applicable. Check bp weekly, let us know if consistently >140 and/or >90.  Follow-up: Return for growth [place] q 4 weeks (Start around 11/1), 3 weeks for HROB w/MD (sign TOLAC consent).   Future Appointments  Date Time Provider Department Center  05/29/2023  3:30 PM [facility] US3 [facility]  06/07/2023  9:30 AM Myna Hidalgo, DO [facility]    Orders Placed This Encounter  Procedures   US OB Follow Up   Tdap vaccine greater than or equal to  7yo IM   Jacklyn Shell , DNP, CNM [facility] 05/17/2023 2:55 PM

## 2023-05-17 NOTE — Patient Instructions (Addendum)
April Shah, I greatly value your feedback.  If you receive a survey following your visit with [place] today, we appreciate you taking the time to fill it out.  Thanks, Cathie Beams, CNM   [facility] HAS MOVED!!! It is now [facility] at [facility] ([address] [place], [place] [address]) Entrance located off of [address] Free 24/7 valet parking   Go to [email or web address] to register for FREE online childbirth classes    Call the office [phone]) or go to [facility] if: You begin to have strong, frequent contractions Your water breaks.  Sometimes it is a big gush of fluid, sometimes it is just a trickle that keeps getting your panties wet or running down your legs You have vaginal bleeding.  It is normal to have a small amount of spotting if your cervix was checked.  You don't feel your baby moving like normal.  If you don't, get you something to eat and drink and lay down and focus on feeling your baby move.  You should feel at least 10 movements in 2 hours.  If you don't, you should call the office or go to [facility].    Tdap Vaccine It is recommended that you get the Tdap vaccine during the third trimester of EACH pregnancy to help protect your baby from getting pertussis (whooping cough) 27-36 weeks is the BEST time to do this so that you can pass the protection on to your baby. During pregnancy is better than after pregnancy, but if you are unable to get it during pregnancy it will be offered at the hospital.  You will be offered this vaccine in the office after 27 weeks. If you do not have health insurance, you can get this vaccine at the health department or your family doctor Everyone who will be around your baby should also be up-to-date on their vaccines. Adults (who are not pregnant) only need 1 dose of Tdap during adulthood.   Third Trimester of Pregnancy The third trimester is from week 29 through week 42, months 7 through 9. The third trimester  is a time when the fetus is growing rapidly. At the end of the ninth month, the fetus is about 20 inches in length and weighs 6-10 pounds.  BODY CHANGES Your body goes through many changes during pregnancy. The changes vary from woman to woman.  Your weight will continue to increase. You can expect to gain 25-35 pounds (11-16 kg) by the end of the pregnancy. You may begin to get stretch marks on your hips, abdomen, and breasts. You may urinate more often because the fetus is moving lower into your pelvis and pressing on your bladder. You may develop or continue to have heartburn as a result of your pregnancy. You may develop constipation because certain hormones are causing the muscles that push waste through your intestines to slow down. You may develop hemorrhoids or swollen, bulging veins (varicose veins). You may have pelvic pain because of the weight gain and pregnancy hormones relaxing your joints between the bones in your pelvis. Backaches may result from overexertion of the muscles supporting your posture. You may have changes in your hair. These can include thickening of your hair, rapid growth, and changes in texture. Some women also have hair loss during or after pregnancy, or hair that feels dry or thin. Your hair will most likely return to normal after your baby is born. Your breasts will continue to grow and be tender. A  yellow discharge may leak from your breasts called colostrum. Your belly button may stick out. You may feel short of breath because of your expanding uterus. You may notice the fetus "dropping," or moving lower in your abdomen. You may have a bloody mucus discharge. This usually occurs a few days to a week before labor begins. Your cervix becomes thin and soft (effaced) near your due date. WHAT TO EXPECT AT YOUR PRENATAL EXAMS  You will have prenatal exams every 2 weeks until week 36. Then, you will have weekly prenatal exams. During a routine prenatal visit: You  will be weighed to make sure you and the fetus are growing normally. Your blood pressure is taken. Your abdomen will be measured to track your baby's growth. The fetal heartbeat will be listened to. Any test results from the previous visit will be discussed. You may have a cervical check near your due date to see if you have effaced. At around 36 weeks, your caregiver will check your cervix. At the same time, your caregiver will also perform a test on the secretions of the vaginal tissue. This test is to determine if a type of bacteria, Group B streptococcus, is present. Your caregiver will explain this further. Your caregiver may ask you: What your birth plan is. How you are feeling. If you are feeling the baby move. If you have had any abnormal symptoms, such as leaking fluid, bleeding, severe headaches, or abdominal cramping. If you have any questions. Other tests or screenings that may be performed during your third trimester include: Blood tests that check for low iron levels (anemia). Fetal testing to check the health, activity level, and growth of the fetus. Testing is done if you have certain medical conditions or if there are problems during the pregnancy. FALSE LABOR You may feel small, irregular contractions that eventually go away. These are called Braxton Hicks contractions, or false labor. Contractions may last for hours, days, or even weeks before true labor sets in. If contractions come at regular intervals, intensify, or become painful, it is best to be seen by your caregiver.  SIGNS OF LABOR  Menstrual-like cramps. Contractions that are 5 minutes apart or less. Contractions that start on the top of the uterus and spread down to the lower abdomen and back. A sense of increased pelvic pressure or back pain. A watery or bloody mucus discharge that comes from the vagina. If you have any of these signs before the 37th week of pregnancy, call your caregiver right away. You need to  go to the hospital to get checked immediately. HOME CARE INSTRUCTIONS  Avoid all smoking, herbs, alcohol, and unprescribed drugs. These chemicals affect the formation and growth of the baby. Follow your caregiver's instructions regarding medicine use. There are medicines that are either safe or unsafe to take during pregnancy. Exercise only as directed by your caregiver. Experiencing uterine cramps is a good sign to stop exercising. Continue to eat regular, healthy meals. Wear a good support bra for breast tenderness. Do not use hot tubs, steam rooms, or saunas. Wear your seat belt at all times when driving. Avoid raw meat, uncooked cheese, cat litter boxes, and soil used by cats. These carry germs that can cause birth defects in the baby. Take your prenatal vitamins. Try taking a stool softener (if your caregiver approves) if you develop constipation. Eat more high-fiber foods, such as fresh vegetables or fruit and whole grains. Drink plenty of fluids to keep your urine clear or pale yellow.  Take warm sitz baths to soothe any pain or discomfort caused by hemorrhoids. Use hemorrhoid cream if your caregiver approves. If you develop varicose veins, wear support hose. Elevate your feet for 15 minutes, 3-4 times a day. Limit salt in your diet. Avoid heavy lifting, wear low heal shoes, and practice good posture. Rest a lot with your legs elevated if you have leg cramps or low back pain. Visit your dentist if you have not gone during your pregnancy. Use a soft toothbrush to brush your teeth and be gentle when you floss. A sexual relationship may be continued unless your caregiver directs you otherwise. Do not travel far distances unless it is absolutely necessary and only with the approval of your caregiver. Take prenatal classes to understand, practice, and ask questions about the labor and delivery. Make a trial run to the hospital. Pack your hospital bag. Prepare the baby's nursery. Continue to  go to all your prenatal visits as directed by your caregiver. SEEK MEDICAL CARE IF: You are unsure if you are in labor or if your water has broken. You have dizziness. You have mild pelvic cramps, pelvic pressure, or nagging pain in your abdominal area. You have persistent nausea, vomiting, or diarrhea. You have a bad smelling vaginal discharge. You have pain with urination. SEEK IMMEDIATE MEDICAL CARE IF:  You have a fever. You are leaking fluid from your vagina. You have spotting or bleeding from your vagina. You have severe abdominal cramping or pain. You have rapid weight loss or gain. You have shortness of breath with chest pain. You notice sudden or extreme swelling of your face, hands, ankles, feet, or legs. You have not felt your baby move in over an hour. You have severe headaches that do not go away with medicine. You have vision changes. Document Released: 08/08/2001 Document Revised: 08/19/2013 Document Reviewed: 10/15/2012 [facility] Patient Information 2015 [place], [place]. This information is not intended to replace advice given to you by your health care provider. Make sure you discuss any questions you have with your health care provider.   Kinesiology taping for pregnancy:  Youtube has good vidoes of "how tos" for lower back, pelvic, hip pain; swelling of feet, etc   [facility] : [phone] or [phone]   Ask for lactation services

## 2023-05-18 LAB — CBC
Hematocrit: 34.8 % (ref 34.0–46.6)
Hemoglobin: 11.6 g/dL (ref 11.1–15.9)
MCH: 29.7 pg (ref 26.6–33.0)
MCHC: 33.3 g/dL (ref 31.5–35.7)
MCV: 89 fL (ref 79–97)
Platelets: 225 10*3/uL (ref 150–450)
RBC: 3.91 x10E6/uL (ref 3.77–5.28)
RDW: 12.3 % (ref 11.7–15.4)
WBC: 7.1 10*3/uL (ref 3.4–10.8)

## 2023-05-18 LAB — GLUCOSE TOLERANCE, 2 HOURS W/ 1HR
Glucose, 1 hour: 94 mg/dL (ref 70–179)
Glucose, 2 hour: 73 mg/dL (ref 70–152)
Glucose, Fasting: 71 mg/dL (ref 70–91)

## 2023-05-18 LAB — RPR: RPR Ser Ql: NONREACTIVE

## 2023-05-18 LAB — HIV ANTIBODY (ROUTINE TESTING W REFLEX): HIV Screen 4th Generation wRfx: NONREACTIVE

## 2023-05-18 LAB — ANTIBODY SCREEN: Antibody Screen: NEGATIVE

## 2023-05-29 ENCOUNTER — Ambulatory Visit: Payer: 59

## 2023-05-31 ENCOUNTER — Ambulatory Visit: Payer: 59

## 2023-06-04 ENCOUNTER — Other Ambulatory Visit: Payer: Self-pay | Admitting: *Deleted

## 2023-06-04 ENCOUNTER — Other Ambulatory Visit: Payer: Self-pay | Admitting: Obstetrics

## 2023-06-04 ENCOUNTER — Ambulatory Visit: Payer: Medicaid Other | Attending: Obstetrics

## 2023-06-04 DIAGNOSIS — O09899 Supervision of other high risk pregnancies, unspecified trimester: Secondary | ICD-10-CM

## 2023-06-04 DIAGNOSIS — O365930 Maternal care for other known or suspected poor fetal growth, third trimester, not applicable or unspecified: Secondary | ICD-10-CM

## 2023-06-04 DIAGNOSIS — O09213 Supervision of pregnancy with history of pre-term labor, third trimester: Secondary | ICD-10-CM

## 2023-06-04 DIAGNOSIS — Z362 Encounter for other antenatal screening follow-up: Secondary | ICD-10-CM | POA: Insufficient documentation

## 2023-06-04 DIAGNOSIS — O3430 Maternal care for cervical incompetence, unspecified trimester: Secondary | ICD-10-CM | POA: Insufficient documentation

## 2023-06-04 DIAGNOSIS — O360130 Maternal care for anti-D [Rh] antibodies, third trimester, not applicable or unspecified: Secondary | ICD-10-CM | POA: Diagnosis not present

## 2023-06-04 DIAGNOSIS — O34219 Maternal care for unspecified type scar from previous cesarean delivery: Secondary | ICD-10-CM

## 2023-06-04 DIAGNOSIS — Z3A28 28 weeks gestation of pregnancy: Secondary | ICD-10-CM

## 2023-06-04 DIAGNOSIS — O3433 Maternal care for cervical incompetence, third trimester: Secondary | ICD-10-CM

## 2023-06-05 ENCOUNTER — Encounter: Payer: Self-pay | Admitting: Obstetrics & Gynecology

## 2023-06-07 ENCOUNTER — Encounter: Payer: Self-pay | Admitting: Obstetrics & Gynecology

## 2023-06-07 ENCOUNTER — Ambulatory Visit (INDEPENDENT_AMBULATORY_CARE_PROVIDER_SITE_OTHER): Payer: Medicaid Other | Admitting: Obstetrics & Gynecology

## 2023-06-07 VITALS — BP 114/68 | HR 74 | Wt 155.0 lb

## 2023-06-07 DIAGNOSIS — Z3A29 29 weeks gestation of pregnancy: Secondary | ICD-10-CM | POA: Diagnosis not present

## 2023-06-07 DIAGNOSIS — O09893 Supervision of other high risk pregnancies, third trimester: Secondary | ICD-10-CM

## 2023-06-07 DIAGNOSIS — O26893 Other specified pregnancy related conditions, third trimester: Secondary | ICD-10-CM | POA: Diagnosis not present

## 2023-06-07 DIAGNOSIS — R350 Frequency of micturition: Secondary | ICD-10-CM | POA: Diagnosis not present

## 2023-06-07 DIAGNOSIS — O26899 Other specified pregnancy related conditions, unspecified trimester: Secondary | ICD-10-CM

## 2023-06-07 DIAGNOSIS — O09899 Supervision of other high risk pregnancies, unspecified trimester: Secondary | ICD-10-CM

## 2023-06-07 DIAGNOSIS — R3915 Urgency of urination: Secondary | ICD-10-CM

## 2023-06-07 DIAGNOSIS — O365930 Maternal care for other known or suspected poor fetal growth, third trimester, not applicable or unspecified: Secondary | ICD-10-CM

## 2023-06-07 DIAGNOSIS — O365990 Maternal care for other known or suspected poor fetal growth, unspecified trimester, not applicable or unspecified: Secondary | ICD-10-CM | POA: Insufficient documentation

## 2023-06-07 LAB — POCT URINALYSIS DIPSTICK OB
Blood, UA: NEGATIVE
Glucose, UA: NEGATIVE
Ketones, UA: NEGATIVE
Leukocytes, UA: NEGATIVE
Nitrite, UA: NEGATIVE
POC,PROTEIN,UA: NEGATIVE

## 2023-06-07 NOTE — Progress Notes (Signed)
HIGH-RISK PREGNANCY VISIT Patient name: April Shah MRN [id number]  Date of birth: 1996-07-10 Chief Complaint:   Routine Prenatal Visit ("Can I get checked for a UTI?")  History of Present Illness:   April Shah is a 27 y.o. G48P0101 female at [redacted]w[redacted]d with an Estimated Date of Delivery: 08/23/23 being seen today for ongoing management of a high-risk pregnancy complicated by:  -Short cervix with h/o cone biopsy -Prior C-section -Weak D, RhoGAM today -HSV  Early FGR- 10/7: EFW 2#5oz (6%)- recent US with MFM.    Today she reports no complaints.   Contractions: Irritability. Vag. Bleeding: None.  Movement: Present. denies leaking of fluid.      02/08/2023   11:05 AM  Depression screen PHQ 2/9  Decreased Interest 3  Down, Depressed, Hopeless 3  PHQ - 2 Score 6  Altered sleeping 2  Tired, decreased energy 3  Change in appetite 1  Feeling bad or failure about yourself  3  Trouble concentrating 0  Moving slowly or fidgety/restless 0  Suicidal thoughts 0  PHQ-9 Score 15     Current Outpatient Medications  Medication Instructions   acyclovir ointment (ZOVIRAX) 5 % 1 application , Topical, Every  3 hours   Prenatal Vit-Fe Fumarate-FA (PRENATAL VITAMIN PO) Oral   progesterone (PROMETRIUM) 200 mg, Vaginal, Daily   valACYclovir (VALTREX) 500 mg, Oral, 2 times daily     Review of Systems:   Pertinent items are noted in HPI Denies abnormal vaginal discharge w/ itching/odor/irritation, headaches, visual changes, shortness of breath, chest pain, abdominal pain, severe nausea/vomiting, or problems with urination or bowel movements unless otherwise stated above. Pertinent History Reviewed:  Reviewed past medical,surgical, social, obstetrical and family history.  Reviewed problem list, medications and allergies. Physical Assessment:   Vitals:   06/07/23 0942  BP: 114/68  Pulse: 74  Weight: 155 lb (70.3 kg)  Body mass index is 29.29 kg/m.           Physical Examination:    General appearance: alert, well appearing, and in no distress  Mental status: normal mood, behavior, speech, dress, motor activity, and thought processes  Skin: warm & dry   Extremities: Edema: None    Cardiovascular: normal heart rate noted  Respiratory: normal respiratory effort, no distress  Abdomen: gravid, soft, non-tender  Pelvic: Cervical exam deferred         Fetal Status: Fetal Heart Rate (bpm): 145   Movement: Present    Fetal Surveillance Testing today: doppler   Chaperone: N/A    Results for orders placed or performed in visit on 06/07/23 (from the past 24 hour(s))  POC Urinalysis Dipstick OB   Collection Time: 06/07/23  9:48 AM  Result Value Ref Range   Color, UA     Clarity, UA     Glucose, UA Negative Negative   Bilirubin, UA     Ketones, UA Negative    Spec Grav, UA     Blood, UA Negative    pH, UA     POC,PROTEIN,UA Negative Negative, Trace, Small (1+), Moderate (2+), Large (3+), 4+   Urobilinogen, UA     Nitrite, UA Negative    Leukocytes, UA Negative Negative   Appearance     Odor       Assessment & Plan:  High-risk pregnancy: G2P0101 at [redacted]w[redacted]d with an Estimated Date of Delivery: 08/23/23   1) Early FGR -plan for weekly dopplers/BPP -initially plan was follow up with MFM, pt would prefer FT -growth every 3-4wks -  timing of delivery dependent upon fetal status  2) Weak D- RhoGAM given today 3) prior PTD due to short cervix and h/o cone biopsy 4) HSV- currently asymptomatic  Meds: No orders of the defined types were placed in this encounter.   Labs/procedures today: RhoGAM  Treatment Plan:  as outlined above and routine OB care  Reviewed: Preterm labor symptoms and general obstetric precautions including but not limited to vaginal bleeding, contractions, leaking of fluid and fetal movement were reviewed in detail with the patient.  All questions were answered. Pt has home bp cuff. Check bp weekly, let us know if >140/90.   Follow-up: Return  for HROB visit weekly with BPP/doppler and growth every 3-4wks.   Future Appointments  Date Time Provider Department Center  06/14/2023  8:30 AM [facility] - FTO[facility]YN [place] [facility] None  06/21/2023  9:50 AM Cheral Marker, CNM [facility]YN  06/21/2023  1:30 PM [facility] - [facility] [place] [facility] None  06/28/2023  9:15 AM [facility] - [facility] [place] [facility] None  06/28/2023 10:10 AM Jacklyn Shell, CNM [facility]TOBGYN  07/05/2023  9:15 AM [facility] - [facility]YN [place] [facility] None  07/12/2023  9:30 AM Myna Hidalgo, DO [facility]TOBGYN  07/24/2023  8:30 AM [facility] - [facility]YN [place] [facility] None  07/24/2023  9:30 AM Cheral Marker, CNM [facility]TO[facility]    Orders Placed This Encounter  Procedures   US OB Follow Up   US FETAL BPP WO NON STRESS   [place] UA Cord Doppler   RHO (D) Immune Globulin   POC Urinalysis Dipstick OB    Myna Hidalgo, DO Attending Obstetrician & Gynecologist, [facility] for [employer], [facility]

## 2023-06-13 ENCOUNTER — Ambulatory Visit: Payer: 59

## 2023-06-14 ENCOUNTER — Ambulatory Visit (INDEPENDENT_AMBULATORY_CARE_PROVIDER_SITE_OTHER): Payer: Medicaid Other | Admitting: Obstetrics & Gynecology

## 2023-06-14 ENCOUNTER — Other Ambulatory Visit: Payer: Medicaid Other

## 2023-06-14 VITALS — BP 125/75 | HR 73 | Wt 155.4 lb

## 2023-06-14 DIAGNOSIS — Z3A30 30 weeks gestation of pregnancy: Secondary | ICD-10-CM

## 2023-06-14 DIAGNOSIS — Z3480 Encounter for supervision of other normal pregnancy, unspecified trimester: Secondary | ICD-10-CM

## 2023-06-14 DIAGNOSIS — O09893 Supervision of other high risk pregnancies, third trimester: Secondary | ICD-10-CM

## 2023-06-14 DIAGNOSIS — N883 Incompetence of cervix uteri: Secondary | ICD-10-CM

## 2023-06-14 DIAGNOSIS — O365930 Maternal care for other known or suspected poor fetal growth, third trimester, not applicable or unspecified: Secondary | ICD-10-CM

## 2023-06-14 DIAGNOSIS — Z98891 History of uterine scar from previous surgery: Secondary | ICD-10-CM

## 2023-06-14 DIAGNOSIS — B009 Herpesviral infection, unspecified: Secondary | ICD-10-CM

## 2023-06-14 DIAGNOSIS — O365990 Maternal care for other known or suspected poor fetal growth, unspecified trimester, not applicable or unspecified: Secondary | ICD-10-CM

## 2023-06-14 DIAGNOSIS — O09899 Supervision of other high risk pregnancies, unspecified trimester: Secondary | ICD-10-CM

## 2023-06-14 NOTE — Progress Notes (Signed)
HIGH-RISK PREGNANCY VISIT Patient name: April Shah MRN [id number]  Date of birth: 02-05-1996 Chief Complaint:   Routine Prenatal Visit  History of Present Illness:   April Shah is a 27 y.o. G62P0101 female at [redacted]w[redacted]d with an Estimated Date of Delivery: 08/23/23 being seen today for ongoing management of a high-risk pregnancy complicated by:  -FGR diagnosed at 28-week -Rh- -Prior C-section, desires TOLAC -HSV asymptomatic  Today she reports no complaints.   Contractions: Not present. Vag. Bleeding: None.  Movement: Present. denies leaking of fluid.      02/08/2023   11:05 AM  Depression screen PHQ 2/9  Decreased Interest 3  Down, Depressed, Hopeless 3  PHQ - 2 Score 6  Altered sleeping 2  Tired, decreased energy 3  Change in appetite 1  Feeling bad or failure about yourself  3  Trouble concentrating 0  Moving slowly or fidgety/restless 0  Suicidal thoughts 0  PHQ-9 Score 15     Current Outpatient Medications  Medication Instructions   acyclovir ointment (ZOVIRAX) 5 % 1 application , Topical, Every  3 hours   Prenatal Vit-Fe Fumarate-FA (PRENATAL VITAMIN PO) Oral   progesterone (PROMETRIUM) 200 mg, Vaginal, Daily   valACYclovir (VALTREX) 500 mg, Oral, 2 times daily     Review of Systems:   Pertinent items are noted in HPI Denies abnormal vaginal discharge w/ itching/odor/irritation, headaches, visual changes, shortness of breath, chest pain, abdominal pain, severe nausea/vomiting, or problems with urination or bowel movements unless otherwise stated above. Pertinent History Reviewed:  Reviewed past medical,surgical, social, obstetrical and family history.  Reviewed problem list, medications and allergies. Physical Assessment:   Vitals:   06/14/23 0911  BP: 125/75  Pulse: 73  Weight: 155 lb 6.4 oz (70.5 kg)  Body mass index is 29.36 kg/m.           Physical Examination:   General appearance: alert, well appearing, and in no distress  Mental status: normal  mood, behavior, speech, dress, motor activity, and thought processes  Skin: warm & dry   Extremities:      Cardiovascular: normal heart rate noted  Respiratory: normal respiratory effort, no distress  Abdomen: gravid, soft, non-tender  Pelvic: Cervical exam deferred         Fetal Status:     Movement: Present    Fetal Surveillance Testing today: ,cephalic,BPP 8/8,FHR 132 bpm, AFI 15 cm,posterior placenta gr 3,RI .77,.68,.73=87%    Chaperone: N/A    No results found for this or any previous visit (from the past 24 hour(s)).   Assessment & Plan:  High-risk pregnancy: G2P0101 at [redacted]w[redacted]d with an Estimated Date of Delivery: 08/23/23   -FGR diagnosed at 28-week BPP 8/8, normal dopplers Continue weekly Dopplers, antepartum testing to increase to twice weekly at 32 weeks Delivery pending fetal status -Rh neg, s/p RhoGAM -Prior C-section, desires TOLAC - We discussed her history of c-section. Her previous c-section was due to  fetal malpresentation.  She has a history of  no prior successful vaginal deliveries - We discussed the risks associated with repeat c-section: bleeding, infection, injury to surrounding organs/tissues I.e. bowel/bladder, development of scar tissue, wound complications such as wound separation or infection, need for additional surgery and discussed potential for future complications such as placenta accreta spectrum  nm- We discussed the risks associated with TOLAC specifically focusing on the risk of uterine rupture. We discussed with the risk of uterine rupture that while rare it is not easily predicted, that it is a surgical  emergency, and it can be potentially catastrophic for mom and baby.  - After counseling, the patient was given the opportunity to ask questions and all questions answered.  - After considering her options, she would like to [facility] - Information provided to the patient  -HSV asymptomatic Currently taking Valtrex as needed, discussed transitioning to  twice daily around 34 weeks   Labs/procedures today: BPP/doppler  Treatment Plan:  as outlined above  Reviewed: Preterm labor symptoms and general obstetric precautions including but not limited to vaginal bleeding, contractions, leaking of fluid and fetal movement were reviewed in detail with the patient.  All questions were answered.   Follow-up: Return for continue as schedule weekly BPP/doppler then in 2wks 2x/wk BPP/NST and continue growht every 4 wks.   Future Appointments  Date Time Provider Department Center  06/21/2023  1:30 PM [facility] - FTO[facility]YN [place] [facility] None  06/21/2023  2:50 PM Cheral Marker, CNM [facility] FTOBGYN  06/28/2023  9:15 AM [facility] - FTOBGYN [place] [facility] None  06/28/2023 10:10 AM Jacklyn Shell, CNM [facility] FTOBGYN  07/02/2023 10:30 AM [facility]FTOBGYN NURSE [facility] FTOBGYN  07/05/2023  9:15 AM [facility] - FTOBGYN [place] [facility] None  07/05/2023 10:10 AM Myna Hidalgo, DO [facility] FTOBGYN  07/12/2023  9:30 AM Myna Hidalgo, DO [facility] FTOBGYN  07/16/2023 10:10 AM [facility]FT[facility] NURSE [facility] FTOBGYN  07/19/2023 10:45 AM [facility] - [facility]YN [place] [facility] None  07/19/2023 11:30 AM Lazaro Arms, MD [facility] FTOBGYN  07/24/2023  8:30 AM [facility] - [facility]TO[facility]YN [place] [facility] None  07/24/2023  9:30 AM Cheral Marker, CNM [facility] FTOBGYN  07/30/2023  9:10 AM [facility]FT[facility]GY[facility] NURSE [facility] FTOBGYN  08/02/2023  9:15 AM [facility] - [facility]YN [place] [facility] None  08/02/2023 10:10 AM Lazaro Arms, MD [facility] FTOBGYN  08/06/2023 10:10 AM [facility]FT[facility]GYN NURSE [facility] FTOBGYN  08/09/2023  9:15 AM [facility] - [facility] [place] [facility] None  08/09/2023 10:10 AM Cheral Marker, CNM [facility] FTOBGYN  08/13/2023  9:50 AM [facility]FT[facility]GYN NURSE [facility] FTOBGYN  08/16/2023  9:15 AM [facility] - [facility]YN [place] [facility] None  08/16/2023 10:10 AM Myna Hidalgo, DO [facility] FTOBGYN  08/20/2023 10:50 AM [facility]FT[facility] NURSE [facility] FTOBGYN    No orders of the defined types were placed in this encounter.   Myna Hidalgo, DO Attending Obstetrician &  Gynecologist, [facility] for [employer], [facility]

## 2023-06-14 NOTE — Progress Notes (Signed)
[place] 30 wks,cephalic,BPP 8/8,FHR 132 bpm, AFI 15 cm,posterior placenta gr 3,RI .77,.68,.73=87%

## 2023-06-19 ENCOUNTER — Ambulatory Visit: Payer: 59

## 2023-06-21 ENCOUNTER — Ambulatory Visit (INDEPENDENT_AMBULATORY_CARE_PROVIDER_SITE_OTHER): Payer: Medicaid Other

## 2023-06-21 ENCOUNTER — Encounter: Payer: Self-pay | Admitting: Women's Health

## 2023-06-21 ENCOUNTER — Telehealth: Payer: Medicaid Other | Admitting: Women's Health

## 2023-06-21 VITALS — BP 125/72 | HR 85

## 2023-06-21 DIAGNOSIS — Z3A31 31 weeks gestation of pregnancy: Secondary | ICD-10-CM | POA: Diagnosis not present

## 2023-06-21 DIAGNOSIS — O365930 Maternal care for other known or suspected poor fetal growth, third trimester, not applicable or unspecified: Secondary | ICD-10-CM

## 2023-06-21 DIAGNOSIS — O0993 Supervision of high risk pregnancy, unspecified, third trimester: Secondary | ICD-10-CM

## 2023-06-21 DIAGNOSIS — O365990 Maternal care for other known or suspected poor fetal growth, unspecified trimester, not applicable or unspecified: Secondary | ICD-10-CM

## 2023-06-21 DIAGNOSIS — N883 Incompetence of cervix uteri: Secondary | ICD-10-CM

## 2023-06-21 DIAGNOSIS — O0990 Supervision of high risk pregnancy, unspecified, unspecified trimester: Secondary | ICD-10-CM

## 2023-06-21 DIAGNOSIS — Z3480 Encounter for supervision of other normal pregnancy, unspecified trimester: Secondary | ICD-10-CM

## 2023-06-21 NOTE — Patient Instructions (Signed)
Nicolasa, thank you for choosing our office today! We appreciate the opportunity to meet your healthcare needs. You may receive a short survey by mail, e-mail, or through [employer]. If you are happy with your care we would appreciate if you could take just a few minutes to complete the survey questions. We read all of your comments and take your feedback very seriously. Thank you again for choosing our office.  Center for [employer] Team at [facility]  [facility] at [facility] ([address] [place], [place] [address]) Entrance C, located off of [address] Free 24/7 valet parking   CLASSES: Go to [email or web address] to register for classes (childbirth, breastfeeding, waterbirth, infant CPR, daddy bootcamp, etc.)  Call the office [phone]) or go to [facility] if: You begin to have strong, frequent contractions Your water breaks.  Sometimes it is a big gush of fluid, sometimes it is just a trickle that keeps getting your panties wet or running down your legs You have vaginal bleeding.  It is normal to have a small amount of spotting if your cervix was checked.  You don't feel your baby moving like normal.  If you don't, get you something to eat and drink and lay down and focus on feeling your baby move.   If your baby is still not moving like normal, you should call the office or go to [facility].  Call the office [phone]) or go to [facility] hospital for these signs of pre-eclampsia: Severe headache that does not go away with Tylenol Visual changes- seeing spots, double, blurred vision Pain under your right breast or upper abdomen that does not go away with Tums or heartburn medicine Nausea and/or vomiting Severe swelling in your hands, feet, and face   Tdap Vaccine It is recommended that you get the Tdap vaccine during the third trimester of EACH pregnancy to help protect your baby from getting pertussis (whooping cough) 27-36 weeks is the BEST time to do this  so that you can pass the protection on to your baby. During pregnancy is better than after pregnancy, but if you are unable to get it during pregnancy it will be offered at the hospital.  You can get this vaccine with [place], at the health department, your family doctor, or some local pharmacies Everyone who will be around your baby should also be up-to-date on their vaccines before the baby comes. Adults (who are not pregnant) only need 1 dose of Tdap during adulthood.   [facility] Pediatricians/Family Doctors [facility]): [address] Colette Ribas, [phone]           [facility]: [address], [phone]                [facility] Medicine [facility]): [address], [phone] (call to ask if accepting patients) [facility] Department: [address], [place], [phone]    [facility] Pediatricians/Family Doctors [facility]): [phone] [address], [phone] [facility]: [address] [place], [phone] [facility] [place]: [address], [phone]  [facility] Doctors  [facility]): [phone] [facility]: [address] [phone]   [facility] Doctors [facility]: [address], [phone]  [facility] Family Doctors  [facility]: [phone], [phone]  Home Blood Pressure Monitoring for Patients   Your provider has recommended that you check your  blood pressure (BP) at least once a week at home. If you do not have a blood pressure cuff at home, one will be provided for you. Contact your provider if you have not received your monitor within 1 week.   Helpful Tips for Accurate Home Blood Pressure Checks  Don't smoke, exercise, or drink caffeine 30 minutes before checking your BP Use the restroom before checking your BP (a full bladder can raise your  pressure) Relax in a comfortable upright chair Feet on the ground Left arm resting comfortably on a flat surface at the level of your heart Legs uncrossed Back supported Sit quietly and don't talk Place the cuff on your bare arm Adjust snuggly, so that only two fingertips can fit between your skin and the top of the cuff Check 2 readings separated by at least one minute Keep a log of your BP readings For a visual, please reference this diagram: http://ccnc.care/bpdiagram  Provider Name: [facility]     Phone: [phone]  Zone 1: ALL CLEAR  Continue to monitor your symptoms:  BP reading is less than 140 (top number) or less than 90 (bottom number)  No right upper stomach pain No headaches or seeing spots No feeling nauseated or throwing up No swelling in face and hands  Zone 2: CAUTION Call your doctor's office for any of the following:  BP reading is greater than 140 (top number) or greater than 90 (bottom number)  Stomach pain under your ribs in the middle or right side Headaches or seeing spots Feeling nauseated or throwing up Swelling in face and hands  Zone 3: EMERGENCY  Seek immediate medical care if you have any of the following:  BP reading is greater than160 (top number) or greater than 110 (bottom number) Severe headaches not improving with Tylenol Serious difficulty catching your breath Any worsening symptoms from Zone 2  Preterm Labor and Birth Information  The normal length of a pregnancy is 39-41 weeks. Preterm labor is when labor starts before 37 completed weeks of pregnancy. What are the risk factors for preterm labor? Preterm labor is more likely to occur in women who: Have certain infections during pregnancy such as a bladder infection, sexually transmitted infection, or infection inside the uterus (chorioamnionitis). Have a shorter-than-normal cervix. Have gone into preterm labor before. Have had surgery on their cervix. Are younger than age 57  or older than age 27. Are African American. Are pregnant with twins or multiple babies (multiple gestation). Take street drugs or smoke while pregnant. Do not gain enough weight while pregnant. Became pregnant shortly after having been pregnant. What are the symptoms of preterm labor? Symptoms of preterm labor include: Cramps similar to those that can happen during a menstrual period. The cramps may happen with diarrhea. Pain in the abdomen or lower back. Regular uterine contractions that may feel like tightening of the abdomen. A feeling of increased pressure in the pelvis. Increased watery or bloody mucus discharge from the vagina. Water breaking (ruptured amniotic sac). Why is it important to recognize signs of preterm labor? It is important to recognize signs of preterm labor because babies who are born prematurely may not be fully developed. This can put them at an increased risk for: Long-term (chronic) heart and lung problems. Difficulty immediately after birth with regulating body systems, including blood sugar, body temperature, heart rate, and breathing rate. Bleeding in the brain. Cerebral palsy. Learning difficulties. Death. These risks are highest for babies who are born before 34 weeks  of pregnancy. How is preterm labor treated? Treatment depends on the length of your pregnancy, your condition, and the health of your baby. It may involve: Having a stitch (suture) placed in your cervix to prevent your cervix from opening too early (cerclage). Taking or being given medicines, such as: Hormone medicines. These may be given early in pregnancy to help support the pregnancy. Medicine to stop contractions. Medicines to help mature the baby's lungs. These may be prescribed if the risk of delivery is high. Medicines to prevent your baby from developing cerebral palsy. If the labor happens before 34 weeks of pregnancy, you may need to stay in the hospital. What should I do if I  think I am in preterm labor? If you think that you are going into preterm labor, call your health care provider right away. How can I prevent preterm labor in future pregnancies? To increase your chance of having a full-term pregnancy: Do not use any tobacco products, such as cigarettes, chewing tobacco, and e-cigarettes. If you need help quitting, ask your health care provider. Do not use street drugs or medicines that have not been prescribed to you during your pregnancy. Talk with your health care provider before taking any herbal supplements, even if you have been taking them regularly. Make sure you gain a healthy amount of weight during your pregnancy. Watch for infection. If you think that you might have an infection, get it checked right away. Make sure to tell your health care provider if you have gone into preterm labor before. This information is not intended to replace advice given to you by your health care provider. Make sure you discuss any questions you have with your health care provider. Document Revised: 12/06/2018 Document Reviewed: 01/05/2016 Elsevier Patient Education  2020 [employer].

## 2023-06-21 NOTE — Progress Notes (Signed)
TELEHEALTH VIRTUAL OBSTETRICS VISIT ENCOUNTER NOTE Patient name: April Shah MRN [id number]  Date of birth: [birth year]  I connected with patient on 06/21/23 at  2:50 PM EDT by MyChart video  and verified that I am speaking with the correct person using two identifiers. Pt is not currently in our office, she is at home/getting child off bus.  The provider is in the office.    I discussed the limitations, risks, security and privacy concerns of performing an evaluation and management service by telephone and the availability of in person appointments. I also discussed with the patient that there may be a patient responsible charge related to this service. The patient expressed understanding and agreed to proceed.  Chief Complaint:   Routine Prenatal Visit  History of Present Illness:   April Shah is a 27 y.o. G32P0101 female at [redacted]w[redacted]d with an Estimated Date of Delivery: 08/23/23 being evaluated today for ongoing management of a high-risk pregnancy complicated by FGR 6% dx @ 28wk, elevated UAD today.     02/08/2023   11:05 AM  Depression screen PHQ 2/9  Decreased Interest 3  Down, Depressed, Hopeless 3  PHQ - 2 Score 6  Altered sleeping 2  Tired, decreased energy 3  Change in appetite 1  Feeling bad or failure about yourself  3  Trouble concentrating 0  Moving slowly or fidgety/restless 0  Suicidal thoughts 0  PHQ-9 Score 15    Today she reports no complaints. Contractions: Not present. Vag. Bleeding: None.  Movement: Present. denies leaking of fluid. Review of Systems:   Pertinent items are noted in HPI Denies abnormal vaginal discharge w/ itching/odor/irritation, headaches, visual changes, shortness of breath, chest pain, abdominal pain, severe nausea/vomiting, or problems with urination or bowel movements unless otherwise stated above. Pertinent History Reviewed:  Reviewed past medical,surgical, social, obstetrical and family history.  Reviewed problem list, medications and  allergies. Physical Assessment:   Vitals:   06/21/23 1503  BP: 125/72  Pulse: 85  There is no height or weight on file to calculate BMI.        Physical Examination:   General:  Alert, oriented and cooperative.   Mental Status: Normal mood and affect perceived. Normal judgment and thought content.  Rest of physical exam deferred due to type of encounter  No results found for this or any previous visit (from the past 24 hour(s)).   Today's U/S: [place] 31 wks,cephalic,BPP 8/8,posterior placenta gr 3,LVEICF,elevated UAD with EDF,RI .71,.73,.75,.76=95%,FHR 138 bpm,AFI 14.8 cm  Assessment & Plan:  1) Pregnancy G2P0101 at [redacted]w[redacted]d with an Estimated Date of Delivery: 08/23/23   2) FGR 6% dx @ 28wk, elevated UAD today, 95% w/ normal EDF- discussed w/ pt EFW next week, weekly bpp/dopp weekly, add NST next week, IOL 37w if UAD remain ^  3) Prev c/s> wants TOLAC  4) H/O PTB @ 35.6wk d/t short cx/PTL   Meds: No orders of the defined types were placed in this encounter.  Labs/procedures today: u/s  Plan:  Continue routine obstetrical care.  Does have home bp cuff. Office bp cuff given: not applicable. Check bp weekly, let us know if consistently >140 and/or >90.  Next visit: prefers will be in person for u/s     Reviewed: Preterm labor symptoms and general obstetric precautions including but not limited to vaginal bleeding, contractions, leaking of fluid and fetal movement were reviewed in detail with the patient. The patient was advised to call back or seek an in-person office evaluation/go  to [facility] at [facility] for any urgent or concerning symptoms. All questions were answered. Please refer to After Visit Summary for other counseling recommendations.    I provided 10 minutes of non-face-to-face time during this encounter.  Follow-up: Return for As scheduled.  No orders of the defined types were placed in this encounter.  Cheral Marker CNM, [facility] 06/21/2023 3:27 PM

## 2023-06-21 NOTE — Progress Notes (Signed)
[place] 31 wks,cephalic,BPP 8/8,posterior placenta gr 3,LVEICF,elevated UAD with EDF,RI .71,.73,.75,.76=95%,FHR 138 bpm

## 2023-06-26 ENCOUNTER — Other Ambulatory Visit: Payer: 59

## 2023-06-26 ENCOUNTER — Encounter: Payer: 59 | Admitting: Women's Health

## 2023-06-26 ENCOUNTER — Ambulatory Visit: Payer: 59

## 2023-06-28 ENCOUNTER — Encounter: Payer: Self-pay | Admitting: Advanced Practice Midwife

## 2023-06-28 ENCOUNTER — Other Ambulatory Visit: Payer: Medicaid Other

## 2023-06-28 ENCOUNTER — Ambulatory Visit: Payer: Medicaid Other | Admitting: Advanced Practice Midwife

## 2023-06-28 VITALS — Wt 158.0 lb

## 2023-06-28 DIAGNOSIS — O09291 Supervision of pregnancy with other poor reproductive or obstetric history, first trimester: Secondary | ICD-10-CM

## 2023-06-28 DIAGNOSIS — O365930 Maternal care for other known or suspected poor fetal growth, third trimester, not applicable or unspecified: Secondary | ICD-10-CM

## 2023-06-28 DIAGNOSIS — Z98891 History of uterine scar from previous surgery: Secondary | ICD-10-CM

## 2023-06-28 DIAGNOSIS — O365990 Maternal care for other known or suspected poor fetal growth, unspecified trimester, not applicable or unspecified: Secondary | ICD-10-CM

## 2023-06-28 DIAGNOSIS — O0990 Supervision of high risk pregnancy, unspecified, unspecified trimester: Secondary | ICD-10-CM

## 2023-06-28 DIAGNOSIS — O09899 Supervision of other high risk pregnancies, unspecified trimester: Secondary | ICD-10-CM

## 2023-06-28 DIAGNOSIS — Z3A32 32 weeks gestation of pregnancy: Secondary | ICD-10-CM

## 2023-06-28 DIAGNOSIS — O09293 Supervision of pregnancy with other poor reproductive or obstetric history, third trimester: Secondary | ICD-10-CM

## 2023-06-28 DIAGNOSIS — B009 Herpesviral infection, unspecified: Secondary | ICD-10-CM

## 2023-06-28 DIAGNOSIS — O09893 Supervision of other high risk pregnancies, third trimester: Secondary | ICD-10-CM

## 2023-06-28 DIAGNOSIS — N883 Incompetence of cervix uteri: Secondary | ICD-10-CM

## 2023-06-28 MED ORDER — VALACYCLOVIR HCL 1 G PO TABS: 1000.0000 mg | ORAL_TABLET | Freq: Two times a day (BID) | ORAL | 6 refills | Status: AC

## 2023-06-28 NOTE — Progress Notes (Signed)
[place] 32 wks,cephalic,BPP 8/8,posterior placenta gr 3,FHR 150 bpm,AFI 12 cm,RI .75,.76,.74=95%,elevated UAD with EDF,EFW 1548 g  6%,AC 2.7%

## 2023-06-28 NOTE — Progress Notes (Signed)
HIGH-RISK PREGNANCY VISIT Patient name: April Shah MRN [id number]  Date of birth: Jul 24, 1996 Chief Complaint:   Routine Prenatal Visit  History of Present Illness:   April Shah is a 27 y.o. G54P0101 female at [redacted]w[redacted]d with an Estimated Date of Delivery: 08/23/23 being seen today for ongoing management of a high-risk pregnancy complicated by elevated dopplers 95%, fetal growth restriction 6%, and short cervix.    Today she reports constipation. Contractions: Not present. Vag. Bleeding: None.  Movement: Present. denies leaking of fluid.      02/08/2023   11:05 AM  Depression screen PHQ 2/9  Decreased Interest 3  Down, Depressed, Hopeless 3  PHQ - 2 Score 6  Altered sleeping 2  Tired, decreased energy 3  Change in appetite 1  Feeling bad or failure about yourself  3  Trouble concentrating 0  Moving slowly or fidgety/restless 0  Suicidal thoughts 0  PHQ-9 Score 15        02/08/2023   11:05 AM  GAD 7 : Generalized Anxiety Score  Nervous, Anxious, on Edge 2  Control/stop worrying 0  Worry too much - different things 2  Trouble relaxing 3  Restless 0  Easily annoyed or irritable 3  Afraid - awful might happen 3  Total GAD 7 Score 13     Review of Systems:   Pertinent items are noted in HPI Denies abnormal vaginal discharge w/ itching/odor/irritation, headaches, visual changes, shortness of breath, chest pain, abdominal pain, severe nausea/vomiting, or problems with urination or bowel movements unless otherwise stated above. Pertinent History Reviewed:  Reviewed past medical,surgical, social, obstetrical and family history.  Reviewed problem list, medications and allergies. Physical Assessment:   Vitals:   06/28/23 1009  Weight: 158 lb (71.7 kg)  Body mass index is 29.85 kg/m.           Physical Examination:   General appearance: alert, well appearing, and in no distress  Mental status: alert, oriented to person, place, and time  Skin: warm & dry   Extremities:  Edema: None    Cardiovascular: normal heart rate noted  Respiratory: normal respiratory effort, no distress  Abdomen: gravid, soft, non-tender  Pelvic: Cervical exam deferred         Chaperone: N/A    Fetal Status:     Movement: Present    Fetal Surveillance Testing today: [place] 32 wks,cephalic,BPP 8/8,posterior placenta gr 3,FHR 150 bpm,AFI 12 cm,RI .75,.76,.74=95%,elevated UAD with EDF,EFW 1548 g  6%,AC 2.7%       No results found for this or any previous visit (from the past 24 hour(s)).  Assessment & Plan:  High-risk pregnancy: G2P0101 at [redacted]w[redacted]d with an Estimated Date of Delivery: 08/23/23   1. [redacted] weeks gestation of pregnancy   2. Fetal growth restriction antepartum Continue testing, deliver as appropriate, goal 37 weeks  3. Herpes simplex Start valtrex now (500mg  BID)  4. History of cesarean delivery Plans TOLAC, consent signted 10/17)  5. Short cervix Continue progesterone    Meds:  Meds ordered this encounter  Medications   valACYclovir (VALTREX) 1000 MG tablet    Sig: Take 1 tablet (1,000 mg total) by mouth 2 (two) times daily.    Dispense:  90 tablet    Refill:  6    Order Specific Question:   Supervising Provider    Answer:   Duane Lope H [[id number]]    Orders: No orders of the defined types were placed in this encounter.    Labs/procedures today:  U/S   Reviewed: Preterm labor symptoms and general obstetric precautions including but not limited to vaginal bleeding, contractions, leaking of fluid and fetal movement were reviewed in detail with the patient.  All questions were answered. Does . Check bp weekly, let us know if consistently >140 and/or >90.  Follow-up: No follow-ups on file.   Future Appointments  Date Time Provider Department Center  07/02/2023 10:30 AM [facility] NURSE [facility]  07/05/2023  9:15 AM [facility] [place] [facility] None  07/05/2023 10:10 AM Myna Hidalgo, DO [facility]  07/12/2023  9:30 AM Myna Hidalgo, DO [facility]  07/16/2023 10:10 AM [facility] NURSE [facility]  07/19/2023 10:45 AM [facility] [place] [facility] None  07/19/2023 11:30 AM Lazaro Arms, MD [facility]  07/24/2023  8:30 AM [facility] [place] [facility] None  07/24/2023  9:30 AM Cheral Marker, CNM [facility]  07/30/2023  9:10 AM [facility] NURSE [facility]  08/02/2023  9:15 AM [facility] [place] [facility] None  08/02/2023 10:10 AM Lazaro Arms, MD [facility]  08/06/2023 10:10 AM [facility] NURSE [facility]  08/09/2023  9:15 AM [facility] [place] [facility] None  08/09/2023 10:10 AM Cheral Marker, CNM [facility]  08/13/2023  9:50 AM [facility] NURSE [facility]  08/16/2023  9:15 AM [facility] [place] [facility] None  08/16/2023 10:10 AM Myna Hidalgo, DO [facility]  08/20/2023 10:50 AM [facility] NURSE [facility]    No orders of the defined types were placed in this encounter.  Jacklyn Shell , DNP, CNM [facility] 06/28/2023 11:14 AM

## 2023-06-28 NOTE — Patient Instructions (Addendum)
Constipation Drink plenty of fluid, preferably water, throughout the day Eat foods high in fiber such as fruits, vegetables, and grains Exercise, such as walking, is a good way to keep your bowels regular Drink warm fluids, especially warm prune juice, or decaf coffee Eat a 1/2 cup of real oatmeal (not instant), 1/2 cup applesauce, and 1/2-1 cup warm prune juice every day If needed, you may take Colace (docusate sodium) stool softener once or twice a day to help keep the stool soft. If you are pregnant, wait until you are out of your first trimester (12-14 weeks of pregnancy) If you still are having problems with constipation, you may take Miralax once daily as needed to help keep your bowels regular.  If you are pregnant, wait until you are out of your first trimester (12-14 weeks of pregnancy)  [facility] offers online women's holistic mental health counseling and therapy provided by licensed mental health counselors and therapists.   You can refer yourself using the link below: (if it isn't clickable from your mychart account, copy and paste it in a new browser).  If you have ANY problems, please let me know and I will help troubleshoot.   [email or web address]

## 2023-06-29 ENCOUNTER — Other Ambulatory Visit: Payer: 59

## 2023-06-29 ENCOUNTER — Encounter: Payer: 59 | Admitting: Obstetrics & Gynecology

## 2023-07-02 ENCOUNTER — Ambulatory Visit (INDEPENDENT_AMBULATORY_CARE_PROVIDER_SITE_OTHER): Payer: Medicaid Other

## 2023-07-02 VITALS — BP 127/71 | HR 79 | Wt 160.0 lb

## 2023-07-02 DIAGNOSIS — O0993 Supervision of high risk pregnancy, unspecified, third trimester: Secondary | ICD-10-CM

## 2023-07-02 DIAGNOSIS — Z3A32 32 weeks gestation of pregnancy: Secondary | ICD-10-CM

## 2023-07-02 DIAGNOSIS — O0990 Supervision of high risk pregnancy, unspecified, unspecified trimester: Secondary | ICD-10-CM

## 2023-07-02 NOTE — Progress Notes (Signed)
   NURSE VISIT- NST  SUBJECTIVE:  April Shah is a 27 y.o. G43P0101 female at [redacted]w[redacted]d, here for a NST for pregnancy complicated by FGR.  She reports active fetal movement, contractions: none, vaginal bleeding: none, membranes: intact.   OBJECTIVE:  BP 127/71   Pulse 79   Wt 160 lb (72.6 kg)   LMP 12/08/2022   BMI 30.23 kg/m   Appears well, no apparent distress  No results found for this or any previous visit (from the past 24 hour(s)).  NST: FHR baseline 130 bpm, Variability: moderate, Accelerations:present, Decelerations:  Absent= Cat 1/reactive Toco: 1 contraction noted in 30 minutes, patient denied feeling it   ASSESSMENT: G2P0101 at [redacted]w[redacted]d with FGR NST reactive  PLAN: EFM strip reviewed by Dr. Despina Hidden   Recommendations: keep next appointment as scheduled    Caralyn Guile  07/02/2023 11:30 AM

## 2023-07-03 ENCOUNTER — Ambulatory Visit: Payer: 59

## 2023-07-05 ENCOUNTER — Ambulatory Visit (INDEPENDENT_AMBULATORY_CARE_PROVIDER_SITE_OTHER): Payer: Medicaid Other | Admitting: Obstetrics & Gynecology

## 2023-07-05 ENCOUNTER — Encounter: Payer: Self-pay | Admitting: Obstetrics & Gynecology

## 2023-07-05 ENCOUNTER — Other Ambulatory Visit (INDEPENDENT_AMBULATORY_CARE_PROVIDER_SITE_OTHER): Payer: Medicaid Other

## 2023-07-05 VITALS — BP 121/77 | HR 74 | Wt 161.2 lb

## 2023-07-05 DIAGNOSIS — O365930 Maternal care for other known or suspected poor fetal growth, third trimester, not applicable or unspecified: Secondary | ICD-10-CM | POA: Diagnosis not present

## 2023-07-05 DIAGNOSIS — O365990 Maternal care for other known or suspected poor fetal growth, unspecified trimester, not applicable or unspecified: Secondary | ICD-10-CM

## 2023-07-05 DIAGNOSIS — B009 Herpesviral infection, unspecified: Secondary | ICD-10-CM

## 2023-07-05 DIAGNOSIS — Z98891 History of uterine scar from previous surgery: Secondary | ICD-10-CM

## 2023-07-05 DIAGNOSIS — O0993 Supervision of high risk pregnancy, unspecified, third trimester: Secondary | ICD-10-CM

## 2023-07-05 DIAGNOSIS — Z3A33 33 weeks gestation of pregnancy: Secondary | ICD-10-CM

## 2023-07-05 DIAGNOSIS — O0990 Supervision of high risk pregnancy, unspecified, unspecified trimester: Secondary | ICD-10-CM

## 2023-07-05 DIAGNOSIS — N883 Incompetence of cervix uteri: Secondary | ICD-10-CM

## 2023-07-05 NOTE — Progress Notes (Signed)
HIGH-RISK PREGNANCY VISIT Patient name: April Shah MRN [id number]  Date of birth: [birth year] Chief Complaint:   Routine Prenatal Visit  History of Present Illness:   April Shah is a 27 y.o. 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   Contractions: Irritability. Vag. Bleeding: None.  Movement: Present. denies leaking of fluid.      02/08/2023   11:05 AM  Depression screen PHQ 2/9  Decreased Interest 3  Down, Depressed, Hopeless 3  PHQ - 2 Score 6  Altered sleeping 2  Tired, decreased energy 3  Change in appetite 1  Feeling bad or failure about yourself  3  Trouble concentrating 0  Moving slowly or fidgety/restless 0  Suicidal thoughts 0  PHQ-9 Score 15     Current Outpatient Medications  Medication Instructions   acyclovir ointment (ZOVIRAX) 5 % 1 application , Topical, Every  3 hours   Prenatal Vit-Fe Fumarate-FA (PRENATAL VITAMIN PO) Oral   progesterone (PROMETRIUM) 200 mg, Vaginal, Daily   valACYclovir (VALTREX) 1,000 mg, Oral, 2 times daily     Review of Systems:   Pertinent items are noted in HPI Denies abnormal vaginal discharge w/ itching/odor/irritation, headaches, visual changes, shortness of breath, chest pain, abdominal pain, severe nausea/vomiting, or problems with urination or bowel movements unless otherwise stated above. Pertinent History Reviewed:  Reviewed past medical,surgical, social, obstetrical and family history.  Reviewed problem list, medications and allergies. Physical Assessment:   Vitals:   07/05/23 0955  BP: 121/77  Pulse: 74  There is no height or weight on file to calculate BMI.           Physical Examination:   General appearance: alert, well appearing, and in no distress  Mental status: normal mood,  behavior, speech, dress, motor activity, and thought processes  Skin: warm & dry   Extremities:      Cardiovascular: normal heart rate noted  Respiratory: normal respiratory effort, no distress  Abdomen: gravid, soft, non-tender  Pelvic: Cervical exam deferred         Fetal Status:     Movement: Present    Fetal Surveillance Testing today: cephalic,posterior placenta gr 3,BPP 8/8,elevated UAD with EDF,RI .76,.73,.82=98%,FHR 130 bpm,AFI 17 cm    Chaperone: N/A    No results found for this or any previous visit (from the past 24 hour(s)).   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   Labs/procedures today: growth, doppler  Treatment Plan:  as outlined above  Reviewed: Preterm labor symptoms and general obstetric precautions including but not limited to vaginal bleeding, contractions, leaking of fluid and fetal movement were reviewed in detail with the patient.  All questions were answered.   Follow-up: No follow-ups on file.   Future Appointments  Date Time Provider Department Center  07/05/2023 10:10 AM Myna Hidalgo, DO [facility]  07/09/2023  1:30 PM [facility] - FT IMG 2 [facility] None  07/09/2023  2:30 PM Eure, Amaryllis Dyke, MD [facility]  FTOBGYN  07/12/2023  9:30 AM Myna Hidalgo, DO [facility]  07/16/2023 10:10 AM [facility] NURSE [facility]  07/19/2023 10:45 AM [facility] [place] [facility] None  07/19/2023 11:30 AM Lazaro Arms, MD [facility]  07/24/2023  8:30 AM [facility] [place] [facility] None  07/24/2023  9:30 AM Cheral Marker, CNM [facility]  07/30/2023  9:10 AM [facility] NURSE [facility]   08/02/2023  9:15 AM [facility] [place] [facility] None  08/02/2023 10:10 AM Lazaro Arms, MD [facility]  08/06/2023 10:10 AM [facility] NURSE [facility]  08/09/2023  9:15 AM [facility] [place] [facility] None  08/09/2023 10:10 AM Cheral Marker, CNM [facility]  08/13/2023  9:50 AM [facility] NURSE [facility]  08/16/2023  9:15 AM [facility] [place] [facility] None  08/16/2023 10:10 AM Myna Hidalgo, DO [facility]  08/20/2023 10:50 AM [facility] NURSE [facility]    No orders of the defined types were placed in this encounter.   Myna Hidalgo, DO Attending Obstetrician & Gynecologist, [facility] for [employer], [facility]

## 2023-07-05 NOTE — Progress Notes (Signed)
[place] 33 wks,cephalic,posterior placenta gr 3,BPP 8/8,elevated UAD with EDF,RI .76,.73,.82=98%,FHR 130 bpm,AFI 17 cm

## 2023-07-09 ENCOUNTER — Encounter: Payer: Self-pay | Admitting: Obstetrics & Gynecology

## 2023-07-09 ENCOUNTER — Other Ambulatory Visit: Payer: Self-pay | Admitting: Obstetrics & Gynecology

## 2023-07-09 ENCOUNTER — Encounter: Payer: Self-pay | Admitting: Women's Health

## 2023-07-09 ENCOUNTER — Ambulatory Visit (INDEPENDENT_AMBULATORY_CARE_PROVIDER_SITE_OTHER): Payer: Medicaid Other | Admitting: Radiology

## 2023-07-09 ENCOUNTER — Ambulatory Visit (INDEPENDENT_AMBULATORY_CARE_PROVIDER_SITE_OTHER): Payer: Medicaid Other | Admitting: Obstetrics & Gynecology

## 2023-07-09 VITALS — BP 111/76 | HR 77 | Wt 160.0 lb

## 2023-07-09 DIAGNOSIS — O365930 Maternal care for other known or suspected poor fetal growth, third trimester, not applicable or unspecified: Secondary | ICD-10-CM

## 2023-07-09 DIAGNOSIS — R350 Frequency of micturition: Secondary | ICD-10-CM

## 2023-07-09 DIAGNOSIS — O365990 Maternal care for other known or suspected poor fetal growth, unspecified trimester, not applicable or unspecified: Secondary | ICD-10-CM

## 2023-07-09 DIAGNOSIS — O0990 Supervision of high risk pregnancy, unspecified, unspecified trimester: Secondary | ICD-10-CM

## 2023-07-09 DIAGNOSIS — O09899 Supervision of other high risk pregnancies, unspecified trimester: Secondary | ICD-10-CM

## 2023-07-09 DIAGNOSIS — Z3A34 34 weeks gestation of pregnancy: Secondary | ICD-10-CM

## 2023-07-09 DIAGNOSIS — R3915 Urgency of urination: Secondary | ICD-10-CM

## 2023-07-09 DIAGNOSIS — Z3A29 29 weeks gestation of pregnancy: Secondary | ICD-10-CM

## 2023-07-09 DIAGNOSIS — Z3A33 33 weeks gestation of pregnancy: Secondary | ICD-10-CM

## 2023-07-09 DIAGNOSIS — O0993 Supervision of high risk pregnancy, unspecified, third trimester: Secondary | ICD-10-CM

## 2023-07-09 LAB — POCT URINALYSIS DIPSTICK OB
Blood, UA: NEGATIVE
Glucose, UA: NEGATIVE
Ketones, UA: NEGATIVE
Leukocytes, UA: NEGATIVE
Nitrite, UA: NEGATIVE
POC,PROTEIN,UA: NEGATIVE

## 2023-07-09 NOTE — Progress Notes (Signed)
GA 33+4 by early ultrasound Last measured 06-28-23 6% Single active female fetus,  Cephalic    FHR = 136bpm Posterior pl  Gr 3    AFI = 14.2cm  50%   SVP = 4.9cm BPP = 8/8   respirations and movements seen,  good swallowing seen   Umb Doppler RI 0.72  94%   no evidence of absent end diastolic flow CL = 2.6cm,   closed    normal ovaries - neg adnexal regions -  no free fluid

## 2023-07-09 NOTE — Progress Notes (Signed)
HIGH-RISK PREGNANCY VISIT Patient name: April Shah MRN [id number]  Date of birth: 01-23-1996 Chief Complaint:   High Risk Gestation ([place] today!!)  History of Present Illness:   April Shah is a 27 y.o. G32P0101 female at [redacted]w[redacted]d with an Estimated Date of Delivery: 08/23/23 being seen today for ongoing management of a high-risk pregnancy complicated by FGR 6% with Royetta Asal.    Today she reports no complaints. Contractions: Irritability. Vag. Bleeding: None.  Movement: Present. denies leaking of fluid.      02/08/2023   11:05 AM  Depression screen PHQ 2/9  Decreased Interest 3  Down, Depressed, Hopeless 3  PHQ - 2 Score 6  Altered sleeping 2  Tired, decreased energy 3  Change in appetite 1  Feeling bad or failure about yourself  3  Trouble concentrating 0  Moving slowly or fidgety/restless 0  Suicidal thoughts 0  PHQ-9 Score 15        02/08/2023   11:05 AM  GAD 7 : Generalized Anxiety Score  Nervous, Anxious, on Edge 2  Control/stop worrying 0  Worry too much - different things 2  Trouble relaxing 3  Restless 0  Easily annoyed or irritable 3  Afraid - awful might happen 3  Total GAD 7 Score 13     Review of Systems:   Pertinent items are noted in HPI Denies abnormal vaginal discharge w/ itching/odor/irritation, headaches, visual changes, shortness of breath, chest pain, abdominal pain, severe nausea/vomiting, or problems with urination or bowel movements unless otherwise stated above. Pertinent History Reviewed:  Reviewed past medical,surgical, social, obstetrical and family history.  Reviewed problem list, medications and allergies. Physical Assessment:   Vitals:   07/09/23 1418  BP: 111/76  Pulse: 77  Weight: 160 lb (72.6 kg)  Body mass index is 30.23 kg/m.           Physical Examination:   General appearance: alert, well appearing, and in no distress  Mental status: alert, oriented to person, place, and time  Skin: warm & dry   Extremities: Edema: None     Cardiovascular: normal heart rate noted  Respiratory: normal respiratory effort, no distress  Abdomen: gravid, soft, non-tender  Pelvic: Cervical exam deferred         Fetal Status:     Movement: Present    Fetal Surveillance Testing today: BPP 8/8 UAD UAD 94%   Chaperone: N/A    Results for orders placed or performed in visit on 07/09/23 (from the past 24 hour(s))  POC Urinalysis Dipstick OB   Collection Time: 07/09/23  2:26 PM  Result Value Ref Range   Color, UA     Clarity, UA     Glucose, UA Negative Negative   Bilirubin, UA     Ketones, UA neg    Spec Grav, UA     Blood, UA neg    pH, UA     POC,PROTEIN,UA Negative Negative, Trace, Small (1+), Moderate (2+), Large (3+), 4+   Urobilinogen, UA     Nitrite, UA neg    Leukocytes, UA Negative Negative   Appearance     Odor      Assessment & Plan:  High-risk pregnancy: G2P0101 at [redacted]w[redacted]d with an Estimated Date of Delivery: 08/23/23      ICD-10-CM   1. Supervision of high risk pregnancy, antepartum  O09.90 POC Urinalysis Dipstick OB    2. Fetal growth restriction antepartum  O36.5990     3. [redacted] weeks gestation of pregnancy  Z3A.33 POC Urinalysis Dipstick OB       Meds: No orders of the defined types were placed in this encounter.   Orders:  Orders Placed This Encounter  Procedures   POC Urinalysis Dipstick OB     Labs/procedures today: U/S  Treatment Plan:  twice weekly surveillance, 37 weeks IOL as it stands now,     Follow-up: Return for keep scheduled.   Future Appointments  Date Time Provider Department Center  07/12/2023  9:30 AM Myna Hidalgo, DO [facility]  07/16/2023 10:10 AM [facility] NURSE [facility]  07/19/2023 10:45 AM [facility] [place] [facility] None  07/19/2023 11:30 AM Lazaro Arms, MD [facility]  07/24/2023  8:30 AM [facility] [place] [facility] None  07/24/2023  9:30 AM Cheral Marker, CNM [facility]  07/30/2023  9:10 AM [facility] NURSE [facility]  08/02/2023   9:15 AM [facility] [place] [facility] None  08/02/2023 10:10 AM Lazaro Arms, MD [facility]  08/06/2023 10:10 AM [facility] NURSE [facility]  08/09/2023  9:15 AM [facility] [place] [facility] None  08/09/2023 10:10 AM Cheral Marker, CNM [facility]  08/13/2023  9:50 AM [facility] NURSE [facility]  08/16/2023  9:15 AM [facility] [place] [facility] None  08/16/2023 10:10 AM Myna Hidalgo, DO [facility]  08/20/2023 10:50 AM [facility] NURSE [facility]    Orders Placed This Encounter  Procedures   POC Urinalysis Dipstick OB   Lazaro Arms  Attending Physician for the [facility] for [facility] 07/09/2023 2:45 PM

## 2023-07-12 ENCOUNTER — Encounter: Payer: Self-pay | Admitting: Obstetrics & Gynecology

## 2023-07-12 ENCOUNTER — Ambulatory Visit: Payer: Medicaid Other | Admitting: Obstetrics & Gynecology

## 2023-07-12 VITALS — BP 121/71 | HR 90 | Wt 161.0 lb

## 2023-07-12 DIAGNOSIS — Z3A34 34 weeks gestation of pregnancy: Secondary | ICD-10-CM

## 2023-07-12 DIAGNOSIS — O365990 Maternal care for other known or suspected poor fetal growth, unspecified trimester, not applicable or unspecified: Secondary | ICD-10-CM

## 2023-07-12 DIAGNOSIS — O0990 Supervision of high risk pregnancy, unspecified, unspecified trimester: Secondary | ICD-10-CM

## 2023-07-12 DIAGNOSIS — O365930 Maternal care for other known or suspected poor fetal growth, third trimester, not applicable or unspecified: Secondary | ICD-10-CM | POA: Diagnosis not present

## 2023-07-12 DIAGNOSIS — O0993 Supervision of high risk pregnancy, unspecified, third trimester: Secondary | ICD-10-CM

## 2023-07-12 NOTE — Progress Notes (Signed)
HIGH-RISK PREGNANCY VISIT Patient name: April Shah MRN [id number]  Date of birth: Dec 13, 1995 Chief Complaint:   High Risk Gestation  History of Present Illness:   April Shah is a 27 y.o. G50P0101 female at [redacted]w[redacted]d with an Estimated Date of Delivery: 08/23/23 being seen today for ongoing management of a high-risk pregnancy complicated by:  -FGR with elevated dopplers -prior C-section -RH neg -HSV2- asymptomatic on suppression  Today she reports no complaints.   Contractions: Irritability. Vag. Bleeding: None.  Movement: Present. denies leaking of fluid.      02/08/2023   11:05 AM  Depression screen PHQ 2/9  Decreased Interest 3  Down, Depressed, Hopeless 3  PHQ - 2 Score 6  Altered sleeping 2  Tired, decreased energy 3  Change in appetite 1  Feeling bad or failure about yourself  3  Trouble concentrating 0  Moving slowly or fidgety/restless 0  Suicidal thoughts 0  PHQ-9 Score 15     Current Outpatient Medications  Medication Instructions   acyclovir ointment (ZOVIRAX) 5 % 1 application , Topical, Every  3 hours   Prenatal Vit-Fe Fumarate-FA (PRENATAL VITAMIN PO) Oral   progesterone (PROMETRIUM) 200 mg, Vaginal, Daily   valACYclovir (VALTREX) 1,000 mg, Oral, 2 times daily     Review of Systems:   Pertinent items are noted in HPI Denies abnormal vaginal discharge w/ itching/odor/irritation, headaches, visual changes, shortness of breath, chest pain, abdominal pain, severe nausea/vomiting, or problems with urination or bowel movements unless otherwise stated above. Pertinent History Reviewed:  Reviewed past medical,surgical, social, obstetrical and family history.  Reviewed problem list, medications and allergies. Physical Assessment:   Vitals:   07/12/23 0942  BP: 121/71  Pulse: 90  Weight: 161 lb (73 kg)  Body mass index is 30.42 kg/m.           Physical Examination:   General appearance: alert, well appearing, and in no distress  Mental status: normal  mood, behavior, speech, dress, motor activity, and thought processes  Skin: warm & dry   Extremities: Edema: None    Cardiovascular: normal heart rate noted  Respiratory: normal respiratory effort, no distress  Abdomen: gravid, soft, non-tender  Pelvic: Cervical exam deferred         Fetal Status:     Movement: Present    Fetal Surveillance Testing today: NST  NST being performed due to FGR   Fetal Monitoring:  Baseline: 130 bpm, Variability: moderate, Accelerations: present, The accelerations are >15 bpm and more than 2 in 20 minutes, and Decelerations: Absent     Final diagnosis:  Reactive NST     Chaperone: N/A    No results found for this or any previous visit (from the past 24 hour(s)).   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   Labs/procedures today: NST   Treatment Plan:  as outlined above  Reviewed: Preterm labor symptoms and general obstetric precautions including but not limited to vaginal bleeding, contractions, leaking of fluid and fetal movement were reviewed in detail with the patient.  All questions were answered. Pt has home bp cuff. Check bp weekly, let us know if >140/90.  Follow-up: No follow-ups on file.   Future Appointments  Date Time Provider Department Center  07/16/2023 10:10 AM [facility] NURSE [facility]  07/19/2023 10:45 AM [facility] [place] [facility] None  07/19/2023 11:30 AM Lazaro Arms, MD [facility]  07/24/2023  8:30 AM [facility] [place] [facility] None  07/24/2023  9:30 AM Cheral Marker, CNM [facility]  07/30/2023  9:10 AM [facility] NURSE [facility]   08/02/2023  9:15 AM [facility] [place] [facility] None  08/02/2023 10:10 AM Lazaro Arms, MD [facility]  08/06/2023 10:10 AM [facility] NURSE [facility]  08/09/2023  9:15 AM [facility] [place] [facility] None  08/09/2023 10:10 AM Cheral Marker, CNM [facility]  08/13/2023  9:50 AM [facility] NURSE [facility]  08/16/2023  9:15 AM [facility] [place] [facility] None  08/16/2023 10:10 AM Myna Hidalgo, DO [facility]  08/20/2023 10:50 AM [facility] NURSE [facility]    No orders of the defined types were placed in this encounter.   Myna Hidalgo, DO Attending Obstetrician & Gynecologist, [facility] for [employer], [facility]

## 2023-07-16 ENCOUNTER — Encounter: Payer: Self-pay | Admitting: Obstetrics & Gynecology

## 2023-07-16 ENCOUNTER — Ambulatory Visit (INDEPENDENT_AMBULATORY_CARE_PROVIDER_SITE_OTHER): Payer: Medicaid Other | Admitting: *Deleted

## 2023-07-16 ENCOUNTER — Encounter: Payer: Self-pay | Admitting: Women's Health

## 2023-07-16 DIAGNOSIS — Z3A34 34 weeks gestation of pregnancy: Secondary | ICD-10-CM | POA: Diagnosis not present

## 2023-07-16 DIAGNOSIS — O365930 Maternal care for other known or suspected poor fetal growth, third trimester, not applicable or unspecified: Secondary | ICD-10-CM | POA: Diagnosis not present

## 2023-07-16 DIAGNOSIS — O0993 Supervision of high risk pregnancy, unspecified, third trimester: Secondary | ICD-10-CM

## 2023-07-16 NOTE — Progress Notes (Signed)
   NURSE VISIT- NST  SUBJECTIVE:  April Shah is a 27 y.o. G47P0101 female at [redacted]w[redacted]d, here for a NST for pregnancy complicated by Elevated dopplers and FGR.  She reports active fetal movement, contractions: occasional, vaginal bleeding: none, membranes: intact.   OBJECTIVE:  BP 118/72   Pulse 76   Wt 162 lb (73.5 kg)   LMP 12/08/2022   BMI 30.61 kg/m   Appears well, no apparent distress  No results found for this or any previous visit (from the past 24 hour(s)).  NST: FHR baseline 150 bpm, Variability: moderate, Accelerations:present, Decelerations:  Absent= Cat 1/reactive Toco: none   ASSESSMENT: G2P0101 at [redacted]w[redacted]d with Elevated dopplers and FGR NST reactive  PLAN: EFM strip reviewed by Joellyn Haff, CNM, [facility]   Recommendations: keep next appointment as scheduled    Jobe Marker  07/16/2023 10:13 AM

## 2023-07-19 ENCOUNTER — Ambulatory Visit: Payer: Medicaid Other | Admitting: Obstetrics & Gynecology

## 2023-07-19 ENCOUNTER — Encounter: Payer: Self-pay | Admitting: Obstetrics & Gynecology

## 2023-07-19 ENCOUNTER — Ambulatory Visit (INDEPENDENT_AMBULATORY_CARE_PROVIDER_SITE_OTHER): Payer: Medicaid Other

## 2023-07-19 VITALS — BP 126/80 | HR 79 | Wt 163.0 lb

## 2023-07-19 DIAGNOSIS — N883 Incompetence of cervix uteri: Secondary | ICD-10-CM

## 2023-07-19 DIAGNOSIS — Z3A35 35 weeks gestation of pregnancy: Secondary | ICD-10-CM

## 2023-07-19 DIAGNOSIS — O09899 Supervision of other high risk pregnancies, unspecified trimester: Secondary | ICD-10-CM | POA: Diagnosis not present

## 2023-07-19 DIAGNOSIS — O0990 Supervision of high risk pregnancy, unspecified, unspecified trimester: Secondary | ICD-10-CM

## 2023-07-19 DIAGNOSIS — O365990 Maternal care for other known or suspected poor fetal growth, unspecified trimester, not applicable or unspecified: Secondary | ICD-10-CM | POA: Diagnosis not present

## 2023-07-19 DIAGNOSIS — O0993 Supervision of high risk pregnancy, unspecified, third trimester: Secondary | ICD-10-CM

## 2023-07-19 NOTE — Progress Notes (Signed)
   Induction Assessment Scheduling Form: Fax to [facility]:  [phone]  April Shah                                                                                   DOB:  01/25/1996                                                            MRN:  [id number]                                                                     Phone #:   [phone]                     Provider:  [facility]  GP:  [id number]                                                            Estimated Date of Delivery: 08/23/23  Dating Criteria: first trimester scan    Medical Indications for induction:  FGR 3%(AC 0.3%) Admission Date/Time:  08/02/23 am Gestational age on admission:  [redacted]w[redacted]d   Filed Weights   07/19/23 1142  Weight: 163 lb (73.9 kg)   HIV:  Non Reactive (09/19 0823) GBS: pending    Cervix not examined(previous C section-->no cytotec)   Method of induction(proposed):  foley + Pitocin   Scheduling Provider Signature:  Lazaro Arms, MD                                            Today's Date:  07/19/2023

## 2023-07-19 NOTE — Progress Notes (Signed)
[place] 35 wks,cephalic,BPP 8/8,FHR 135 bpm,posterior placenta gr 3,RI .66,.66,.61=77%,EFW 1945 g 3%,AC .3%

## 2023-07-19 NOTE — Progress Notes (Signed)
HIGH-RISK PREGNANCY VISIT Patient name: April Shah MRN [id number]  Date of birth: 05-27-1996 Chief Complaint:   Routine Prenatal Visit (Ultrasound today. Belly button is tender, having yellow vaginal discharge. Denies having any odor or vaginal irritation.)  History of Present Illness:   April Shah is a 27 y.o. G33P0101 female at [redacted]w[redacted]d with an Estimated Date of Delivery: 08/23/23 being seen today for ongoing management of a high-risk pregnancy complicated by FGR 3% with UAD 77%, previous C section.    Today she reports no complaints. Contractions: Not present. Vag. Bleeding: None.  Movement: Present. denies leaking of fluid.      02/08/2023   11:05 AM  Depression screen PHQ 2/9  Decreased Interest 3  Down, Depressed, Hopeless 3  PHQ - 2 Score 6  Altered sleeping 2  Tired, decreased energy 3  Change in appetite 1  Feeling bad or failure about yourself  3  Trouble concentrating 0  Moving slowly or fidgety/restless 0  Suicidal thoughts 0  PHQ-9 Score 15        02/08/2023   11:05 AM  GAD 7 : Generalized Anxiety Score  Nervous, Anxious, on Edge 2  Control/stop worrying 0  Worry too much - different things 2  Trouble relaxing 3  Restless 0  Easily annoyed or irritable 3  Afraid - awful might happen 3  Total GAD 7 Score 13     Review of Systems:   Pertinent items are noted in HPI Denies abnormal vaginal discharge w/ itching/odor/irritation, headaches, visual changes, shortness of breath, chest pain, abdominal pain, severe nausea/vomiting, or problems with urination or bowel movements unless otherwise stated above. Pertinent History Reviewed:  Reviewed past medical,surgical, social, obstetrical and family history.  Reviewed problem list, medications and allergies. Physical Assessment:   Vitals:   07/19/23 1142  BP: 126/80  Pulse: 79  Weight: 163 lb (73.9 kg)  Body mass index is 30.8 kg/m.           Physical Examination:   General appearance: alert, well  appearing, and in no distress  Mental status: alert, oriented to person, place, and time  Skin: warm & dry   Extremities: Edema: None    Cardiovascular: normal heart rate noted  Respiratory: normal respiratory effort, no distress  Abdomen: gravid, soft, non-tender  Pelvic: Cervical exam deferred         Fetal Status:     Movement: Present    Fetal Surveillance Testing today: BPP 8/8, EFW 3% UAD 77%-->IOL 37 w scheduled   Chaperone:     No results found for this or any previous visit (from the past 24 hour(s)).  Assessment & Plan:  High-risk pregnancy: G2P0101 at [redacted]w[redacted]d with an Estimated Date of Delivery: 08/23/23      ICD-10-CM   1. Supervision of high risk pregnancy, antepartum  O09.90     2. Fetal growth restriction: 3% AC 0.3% UAD 77% (35w)  O36.5990         Meds: No orders of the defined types were placed in this encounter.   Orders: No orders of the defined types were placed in this encounter.    Labs/procedures today: U/S  Treatment Plan:  IOL 37w, keep scheduled appts    Follow-up: No follow-ups on file.   Future Appointments  Date Time Provider Department Center  07/24/2023  8:30 AM [facility] - [facility]YN [place] [facility] None  07/24/2023  9:30 AM Cheral Marker, CNM [facility]  07/30/2023  9:10 AM [facility] NURSE  [facility]  08/02/2023  9:15 AM [facility] [place] [facility] None  08/02/2023 10:10 AM Lazaro Arms, MD [facility]  08/06/2023 10:10 AM [facility] NURSE [facility]  08/09/2023  9:15 AM [facility] [place] [facility] None  08/09/2023 10:10 AM Cheral Marker, CNM [facility]  08/13/2023  9:50 AM [facility] NURSE [facility]  08/16/2023  9:15 AM [facility] [place] [facility] None  08/16/2023 10:10 AM Myna Hidalgo, DO [facility]  08/20/2023 10:50 AM [facility] NURSE [facility]    No orders of the defined types were placed in this encounter.  Lazaro Arms  Attending Physician for the Center for [facility] 07/19/2023 12:06 PM

## 2023-07-24 ENCOUNTER — Encounter: Payer: Self-pay | Admitting: Women's Health

## 2023-07-24 ENCOUNTER — Ambulatory Visit: Payer: Medicaid Other

## 2023-07-24 ENCOUNTER — Encounter: Payer: 59 | Admitting: Women's Health

## 2023-07-24 ENCOUNTER — Other Ambulatory Visit (HOSPITAL_COMMUNITY)
Admission: RE | Admit: 2023-07-24 | Discharge: 2023-07-24 | Disposition: A | Discharge status: Home / Self Care | Payer: Medicaid Other | Source: Ambulatory Visit | Attending: Women's Health | Admitting: Women's Health

## 2023-07-24 ENCOUNTER — Ambulatory Visit (INDEPENDENT_AMBULATORY_CARE_PROVIDER_SITE_OTHER): Payer: Medicaid Other | Admitting: Women's Health

## 2023-07-24 ENCOUNTER — Other Ambulatory Visit: Payer: 59

## 2023-07-24 VITALS — BP 127/82 | HR 78 | Wt 163.0 lb

## 2023-07-24 DIAGNOSIS — N883 Incompetence of cervix uteri: Secondary | ICD-10-CM

## 2023-07-24 DIAGNOSIS — Z3A35 35 weeks gestation of pregnancy: Secondary | ICD-10-CM

## 2023-07-24 DIAGNOSIS — O0990 Supervision of high risk pregnancy, unspecified, unspecified trimester: Secondary | ICD-10-CM

## 2023-07-24 DIAGNOSIS — O365930 Maternal care for other known or suspected poor fetal growth, third trimester, not applicable or unspecified: Secondary | ICD-10-CM

## 2023-07-24 DIAGNOSIS — O0993 Supervision of high risk pregnancy, unspecified, third trimester: Secondary | ICD-10-CM | POA: Diagnosis present

## 2023-07-24 DIAGNOSIS — O365990 Maternal care for other known or suspected poor fetal growth, unspecified trimester, not applicable or unspecified: Secondary | ICD-10-CM

## 2023-07-24 LAB — OB RESULTS CONSOLE GBS: GBS: NEGATIVE

## 2023-07-24 NOTE — Progress Notes (Signed)
HIGH-RISK PREGNANCY VISIT Patient name: April Shah MRN [id number]  Date of birth: Jul 24, 1996 Chief Complaint:   Routine Prenatal Visit (Ultrasound today/)  History of Present Illness:   April Shah is a 27 y.o. G44P0101 female at [redacted]w[redacted]d with an Estimated Date of Delivery: 08/23/23 being seen today for ongoing management of a high-risk pregnancy complicated by fetal growth restriction 3% w/ elevated UAD.    Today she reports no complaints. Contractions: Not present. Vag. Bleeding: None.  Movement: Present. denies leaking of fluid.      02/08/2023   11:05 AM  Depression screen PHQ 2/9  Decreased Interest 3  Down, Depressed, Hopeless 3  PHQ - 2 Score 6  Altered sleeping 2  Tired, decreased energy 3  Change in appetite 1  Feeling bad or failure about yourself  3  Trouble concentrating 0  Moving slowly or fidgety/restless 0  Suicidal thoughts 0  PHQ-9 Score 15        02/08/2023   11:05 AM  GAD 7 : Generalized Anxiety Score  Nervous, Anxious, on Edge 2  Control/stop worrying 0  Worry too much - different things 2  Trouble relaxing 3  Restless 0  Easily annoyed or irritable 3  Afraid - awful might happen 3  Total GAD 7 Score 13     Review of Systems:   Pertinent items are noted in HPI Denies abnormal vaginal discharge w/ itching/odor/irritation, headaches, visual changes, shortness of breath, chest pain, abdominal pain, severe nausea/vomiting, or problems with urination or bowel movements unless otherwise stated above. Pertinent History Reviewed:  Reviewed past medical,surgical, social, obstetrical and family history.  Reviewed problem list, medications and allergies. Physical Assessment:   Vitals:   07/24/23 0907  BP: 127/82  Pulse: 78  Weight: 163 lb (73.9 kg)  Body mass index is 30.8 kg/m.           Physical Examination:   General appearance: alert, well appearing, and in no distress  Mental status: alert, oriented to person, place, and time  Skin: warm &  dry   Extremities: Edema: None    Cardiovascular: normal heart rate noted  Respiratory: normal respiratory effort, no distress  Abdomen: gravid, soft, non-tender  Pelvic: Cervical exam performed  Dilation: 3 Effacement (%): 90 Station: -2  Fetal Status:     Movement: Present Presentation: Vertex  Fetal Surveillance Testing today: [place] 35+5 wks,cephalic,BPP 8/8,FHR 133 bpm,posterior placenta gr 3,AFI 15 cm,elevated UAD with EDF,RI .72,.71,.77=97%   Chaperone: Sherri Kaywood    No results found for this or any previous visit (from the past 24 hour(s)).  Assessment & Plan:  High-risk pregnancy: G2P0101 at [redacted]w[redacted]d with an Estimated Date of Delivery: 08/23/23   1) FGR 3% w/ elevated UAD, IOL as scheduled 12/5 @ 37w, pt to go to [facility] this Friday for NST (office closed for holiday), will go when she gets off work at [employer] ([facility] providers notified)  2) Prev c/s, for breech @ 10cm, wants TOLAC (consent 10/17)  3) H/O 35wk PTB d/t spontaneous PTL  Meds: No orders of the defined types were placed in this encounter.  Labs/procedures today: GBS, GC/CT, SVE, and U/S  Treatment Plan:  NST Frid (MAU), bpp/dopp Mon, IOL 12/5 as scheduled  Reviewed: Preterm labor symptoms and general obstetric precautions including but not limited to vaginal bleeding, contractions, leaking of fluid and fetal movement were reviewed in detail with the patient.  All questions were answered. Does have home bp cuff. Office bp cuff given: not  applicable. Check bp weekly, let us know if consistently >140 and/or >90.  Follow-up: Return for Fri NST in [facility], Mon 12/2 for bpp/dopp & HROB.   Future Appointments  Date Time Provider Department Center  07/30/2023  3:00 PM [facility] - [facility]YN [place] [facility] None  07/30/2023  3:50 PM Myna Hidalgo, DO [facility]  08/02/2023  7:15 AM [facility] SCHED ROOM [facility] None    No orders of the defined types were placed in this encounter.  Cheral Marker CNM, [facility] 07/24/2023 9:59 AM

## 2023-07-24 NOTE — Addendum Note (Signed)
Addended by: Freddie Apley R on: 07/24/2023 11:03 AM   Modules accepted: Orders

## 2023-07-24 NOTE — Patient Instructions (Signed)
April Shah, thank you for choosing our office today! We appreciate the opportunity to meet your healthcare needs. You may receive a short survey by mail, e-mail, or through [employer]. If you are happy with your care we would appreciate if you could take just a few minutes to complete the survey questions. We read all of your comments and take your feedback very seriously. Thank you again for choosing our office.  Center for [employer] Team at [facility]  [facility] at [facility] ([address] [place], [place] [address]) Entrance C, located off of [address] Free 24/7 valet parking   CLASSES: Go to [email or web address] to register for classes (childbirth, breastfeeding, waterbirth, infant CPR, daddy bootcamp, etc.)  Call the office [phone]) or go to [facility] if: You begin to have strong, frequent contractions Your water breaks.  Sometimes it is a big gush of fluid, sometimes it is just a trickle that keeps getting your panties wet or running down your legs You have vaginal bleeding.  It is normal to have a small amount of spotting if your cervix was checked.  You don't feel your baby moving like normal.  If you don't, get you something to eat and drink and lay down and focus on feeling your baby move.   If your baby is still not moving like normal, you should call the office or go to [facility].  Call the office [phone]) or go to [facility] hospital for these signs of pre-eclampsia: Severe headache that does not go away with Tylenol Visual changes- seeing spots, double, blurred vision Pain under your right breast or upper abdomen that does not go away with Tums or heartburn medicine Nausea and/or vomiting Severe swelling in your hands, feet, and face   Tdap Vaccine It is recommended that you get the Tdap vaccine during the third trimester of EACH pregnancy to help protect your baby from getting pertussis (whooping cough) 27-36 weeks is the BEST time to do this  so that you can pass the protection on to your baby. During pregnancy is better than after pregnancy, but if you are unable to get it during pregnancy it will be offered at the hospital.  You can get this vaccine with [place], at the health department, your family doctor, or some local pharmacies Everyone who will be around your baby should also be up-to-date on their vaccines before the baby comes. Adults (who are not pregnant) only need 1 dose of Tdap during adulthood.   [facility] Pediatricians/Family Doctors [facility]): [address] Colette Ribas, [phone]           [facility]: [address], [phone]                [facility]): [address], [phone] (call to ask if accepting patients) [facility] Department: [address], [place], [phone]    [facility] Pediatricians/Family Doctors [facility]): [phone] [address], [phone] [facility]: [address] [place], [phone] [facility]: [address], [phone]  [facility] Doctors  [facility]): [phone] [facility]: [address], [phone]   [facility] Doctors [facility]: [address], [phone]  [facility] Family Doctors  [facility]: [phone], [phone]  Home Blood Pressure Monitoring for Patients   Your provider has recommended that you check your  blood pressure (BP) at least once a week at home. If you do not have a blood pressure cuff at home, one will be provided for you. Contact your provider if you have not received your monitor within 1 week.   Helpful Tips for Accurate Home Blood Pressure Checks  Don't smoke, exercise, or drink caffeine 30 minutes before checking your BP Use the restroom before checking your BP (a full bladder can raise your  pressure) Relax in a comfortable upright chair Feet on the ground Left arm resting comfortably on a flat surface at the level of your heart Legs uncrossed Back supported Sit quietly and don't talk Place the cuff on your bare arm Adjust snuggly, so that only two fingertips can fit between your skin and the top of the cuff Check 2 readings separated by at least one minute Keep a log of your BP readings For a visual, please reference this diagram: http://ccnc.care/bpdiagram  Provider Name: [facility]     Phone: [phone]  Zone 1: ALL CLEAR  Continue to monitor your symptoms:  BP reading is less than 140 (top number) or less than 90 (bottom number)  No right upper stomach pain No headaches or seeing spots No feeling nauseated or throwing up No swelling in face and hands  Zone 2: CAUTION Call your doctor's office for any of the following:  BP reading is greater than 140 (top number) or greater than 90 (bottom number)  Stomach pain under your ribs in the middle or right side Headaches or seeing spots Feeling nauseated or throwing up Swelling in face and hands  Zone 3: EMERGENCY  Seek immediate medical care if you have any of the following:  BP reading is greater than160 (top number) or greater than 110 (bottom number) Severe headaches not improving with Tylenol Serious difficulty catching your breath Any worsening symptoms from Zone 2  Preterm Labor and Birth Information  The normal length of a pregnancy is 39-41 weeks. Preterm labor is when labor starts before 37 completed weeks of pregnancy. What are the risk factors for preterm labor? Preterm labor is more likely to occur in women who: Have certain infections during pregnancy such as a bladder infection, sexually transmitted infection, or infection inside the uterus (chorioamnionitis). Have a shorter-than-normal cervix. Have gone into preterm labor before. Have had surgery on their cervix. Are younger than age 80  or older than age 23. Are African American. Are pregnant with twins or multiple babies (multiple gestation). Take street drugs or smoke while pregnant. Do not gain enough weight while pregnant. Became pregnant shortly after having been pregnant. What are the symptoms of preterm labor? Symptoms of preterm labor include: Cramps similar to those that can happen during a menstrual period. The cramps may happen with diarrhea. Pain in the abdomen or lower back. Regular uterine contractions that may feel like tightening of the abdomen. A feeling of increased pressure in the pelvis. Increased watery or bloody mucus discharge from the vagina. Water breaking (ruptured amniotic sac). Why is it important to recognize signs of preterm labor? It is important to recognize signs of preterm labor because babies who are born prematurely may not be fully developed. This can put them at an increased risk for: Long-term (chronic) heart and lung problems. Difficulty immediately after birth with regulating body systems, including blood sugar, body temperature, heart rate, and breathing rate. Bleeding in the brain. Cerebral palsy. Learning difficulties. Death. These risks are highest for babies who are born before 34 weeks  of pregnancy. How is preterm labor treated? Treatment depends on the length of your pregnancy, your condition, and the health of your baby. It may involve: Having a stitch (suture) placed in your cervix to prevent your cervix from opening too early (cerclage). Taking or being given medicines, such as: Hormone medicines. These may be given early in pregnancy to help support the pregnancy. Medicine to stop contractions. Medicines to help mature the baby's lungs. These may be prescribed if the risk of delivery is high. Medicines to prevent your baby from developing cerebral palsy. If the labor happens before 34 weeks of pregnancy, you may need to stay in the hospital. What should I do if I  think I am in preterm labor? If you think that you are going into preterm labor, call your health care provider right away. How can I prevent preterm labor in future pregnancies? To increase your chance of having a full-term pregnancy: Do not use any tobacco products, such as cigarettes, chewing tobacco, and e-cigarettes. If you need help quitting, ask your health care provider. Do not use street drugs or medicines that have not been prescribed to you during your pregnancy. Talk with your health care provider before taking any herbal supplements, even if you have been taking them regularly. Make sure you gain a healthy amount of weight during your pregnancy. Watch for infection. If you think that you might have an infection, get it checked right away. Make sure to tell your health care provider if you have gone into preterm labor before. This information is not intended to replace advice given to you by your health care provider. Make sure you discuss any questions you have with your health care provider. Document Revised: 12/06/2018 Document Reviewed: 01/05/2016 Elsevier Patient Education  2020 [employer].

## 2023-07-24 NOTE — Progress Notes (Signed)
[place] 35+5 wks,cephalic,BPP 8/8,FHR 133 bpm,posterior placenta gr 3,AFI 15 cm,elevated UAD with EDF,RI .72,.71,.77=97%

## 2023-07-25 ENCOUNTER — Other Ambulatory Visit: Payer: 59

## 2023-07-25 LAB — CERVICOVAGINAL ANCILLARY ONLY
Chlamydia: NEGATIVE
Comment: NEGATIVE
Comment: NORMAL
Neisseria Gonorrhea: NEGATIVE

## 2023-07-26 LAB — STREP GP B NAA+RFLX: Strep Gp B NAA+Rflx: NEGATIVE

## 2023-07-27 ENCOUNTER — Telehealth (HOSPITAL_COMMUNITY): Payer: Self-pay | Admitting: *Deleted

## 2023-07-27 ENCOUNTER — Encounter: Payer: Self-pay | Admitting: Certified Nurse Midwife

## 2023-07-27 ENCOUNTER — Inpatient Hospital Stay (HOSPITAL_COMMUNITY)
Admission: AD | Admit: 2023-07-27 | Discharge: 2023-07-27 | Disposition: A | Discharge status: Home / Self Care | Payer: Medicaid Other | Attending: Obstetrics and Gynecology | Admitting: Obstetrics and Gynecology

## 2023-07-27 ENCOUNTER — Encounter (HOSPITAL_COMMUNITY): Payer: Self-pay | Admitting: Obstetrics and Gynecology

## 2023-07-27 ENCOUNTER — Encounter (HOSPITAL_COMMUNITY): Payer: Self-pay | Admitting: *Deleted

## 2023-07-27 DIAGNOSIS — O365930 Maternal care for other known or suspected poor fetal growth, third trimester, not applicable or unspecified: Secondary | ICD-10-CM | POA: Diagnosis not present

## 2023-07-27 DIAGNOSIS — Z3A36 36 weeks gestation of pregnancy: Secondary | ICD-10-CM | POA: Insufficient documentation

## 2023-07-27 DIAGNOSIS — Z3689 Encounter for other specified antenatal screening: Secondary | ICD-10-CM | POA: Insufficient documentation

## 2023-07-27 NOTE — Discharge Instructions (Addendum)
Keep all scheduled appointments.   Doula Referral  You are interested in a doula and our agency very much supports families having a doula in pregnancy, labor and postpartum. A doula is trained birth support person that helps families cope with and move through labor. Trained birth support persons have been shown to improve pregnancy and delivery outcomes.  They are an important part of the medical team.   [facility] had a process for pairing patients with trained doulas who are registered through [facility]. These doulas are able to be with you during labor and are part of the medical team which means you are able to still have the same number of family and friends in the room even if you have a doula. You can sign up to work with a doula through [employer]. They can see clients at [facility] in [place].    Step 1: Fill out this online form  [email or web address]  Step 2: A [employer] representative will contact you at the number you provide to help get you paired with a doula.  Options for Doula Care in the [place]  As you review your birthing options, consider having a birth doula. A doula is trained to provide support before, during and just after you give birth. There are also postpartum doulas that help you adjust to new parenthood.  While doulas do not provide medical care, they do provide emotional, physical and educational support. A few months before your baby arrives, doulas can help answer questions, ease concerns and help you create and support your birthing plan.    Doulas can help reduce your stress and comfort you and your partner. They can help you cope with labor by helping you use breathing techniques, massage, creative labor positioning, essential oils and affirmations.   Studies show that the  benefits of having a doula include:   A more positive birth experience  Fewer requests for pain-relief medication  Less likelihood of cesarean section, commonly called a c-section   Doulas are typically hired via a [employer] planner between you and the doula. We are happy to provide a list of the most active doulas in the area, all of whom are credentialed by [facility] and will not count as a visitor at your birth.  There are several options for no-cost doula care at our hospital, including:  [facility] Volunteer Doula Program Every [employer] Program A Cure 4 Moms Doula Study (available only at [employer] for Women, [place], [place] and [facility] offices)  For more information on these programs or to receive a list of doulas active in our area, please email [email or web address]

## 2023-07-27 NOTE — MAU Provider Note (Signed)
History     CSN: [id number]  Arrival date and time: 07/27/23 1717   None     Chief Complaint  Patient presents with   Non-stress Test   Patient presents for NST, patient followed for FGR with elevated UAD. Office closed today, so NST scheduled here. Patient reports regular and vigorous fetal movement. Patient has follow up Monday for BPP and dopplers, with IOL 08/02/23. Denies any complaints, pain, VB, or LOF.  Does request doula information.     OB History     Gravida  2   Para  1   Term      Preterm  1   AB      Living  1      SAB      IAB      Ectopic      Multiple      Live Births  1        Obstetric Comments  Found to be double footling breech at 10cm dilated. LTCS         Past Medical History:  Diagnosis Date   Herpes simplex    Vaginal Pap smear, abnormal     Past Surgical History:  Procedure Laterality Date   CESAREAN SECTION     WISDOM TOOTH EXTRACTION      Family History  Problem Relation Age of Onset   Cervical cancer Mother    Diabetes Maternal Grandmother    Diabetes Maternal Grandfather    Hypertension Paternal Grandmother    Diabetes Paternal Grandmother    Pancreatic cancer Paternal Grandmother    Hypertension Paternal Grandfather    Diabetes Paternal Grandfather     Social History   Tobacco Use   Smoking status: Former    Types: Cigarettes   Smokeless tobacco: Never  Vaping Use   Vaping status: Never Used  Substance Use Topics   Alcohol use: Never   Drug use: Never    Allergies:  Allergies  Allergen Reactions   Penicillins Anaphylaxis    No medications prior to admission.    Review of Systems  Constitutional:  Negative for chills, fatigue, fever and unexpected weight change.  Respiratory:  Negative for cough and shortness of breath.   Cardiovascular:  Negative for chest pain and palpitations.  Gastrointestinal:  Negative for abdominal pain, constipation, diarrhea, nausea and vomiting.   Genitourinary:  Negative for difficulty urinating, flank pain, frequency and urgency.   Physical Exam   Blood pressure 121/77, pulse 71, temperature 98 F (36.7 C), temperature source Oral, resp. rate 16, weight 75.3 kg, last menstrual period 12/08/2022, SpO2 100%.  Physical Exam Vitals reviewed.  Constitutional:      Appearance: Normal appearance.  HENT:     Head: Normocephalic.  Cardiovascular:     Rate and Rhythm: Normal rate.  Pulmonary:     Effort: Pulmonary effort is normal.  Skin:    General: Skin is warm and dry.     Capillary Refill: Capillary refill takes less than 2 seconds.  Neurological:     Mental Status: She is alert and oriented to person, place, and time.  Psychiatric:        Mood and Affect: Mood normal.        Behavior: Behavior normal.        Thought Content: Thought content normal.        Judgment: Judgment normal.     Fetal Assessment 130 bpm, Mod Var, -Decels, +Accels Toco: UI   MAU Course  No results  found for this or any previous visit (from the past 24 hour(s)). No results found.  MDM PE NST  Assessment and Plan  27yo  G2P1001  SIUP at 36.1 weeks Cat 1 FT/ reactive NST   - Exam findings discussed. Reactive NST.  - Keep all scheduled appointments. Closely followed for FGR and elevated UAD.  - Doula information provided per patient request. Advised that it will likely be difficult to find doula coverage before IOL 12/5.  - Counseled on labor signs, when to return to MAU. Strict fetal movement counts counseled. - Return to MAU PRN.  - Discharged in stable condition.    Richardson Landry MSN, CNM 07/27/2023, 6:54 PM

## 2023-07-27 NOTE — Telephone Encounter (Signed)
Preadmission screen  

## 2023-07-27 NOTE — MAU Note (Signed)
.  April Shah is a 26 y.o. at [redacted]w[redacted]d here in MAU reporting: she was sent here for an NST for FGR. IOL scheduled for 12/5. Previous C/S due to breech - planning TOLAC. Denies any complaints, pain, VB, or LOF. Reports + FM.  LMP: N/A Onset of complaint: Today Pain score: Denies pain Vitals:   07/27/23 1729  BP: 129/77  Pulse: 74  Resp: 16  Temp: 98 F (36.7 C)  SpO2: 100%     FHT:135  Lab orders placed from triage:  N/A

## 2023-07-30 ENCOUNTER — Encounter: Payer: Self-pay | Admitting: Obstetrics & Gynecology

## 2023-07-30 ENCOUNTER — Ambulatory Visit: Payer: Medicaid Other

## 2023-07-30 ENCOUNTER — Ambulatory Visit: Payer: Medicaid Other | Admitting: Obstetrics & Gynecology

## 2023-07-30 ENCOUNTER — Other Ambulatory Visit: Payer: Medicaid Other

## 2023-07-30 VITALS — BP 127/79 | HR 77 | Wt 166.2 lb

## 2023-07-30 DIAGNOSIS — O365930 Maternal care for other known or suspected poor fetal growth, third trimester, not applicable or unspecified: Secondary | ICD-10-CM

## 2023-07-30 DIAGNOSIS — Z3A36 36 weeks gestation of pregnancy: Secondary | ICD-10-CM

## 2023-07-30 DIAGNOSIS — O34219 Maternal care for unspecified type scar from previous cesarean delivery: Secondary | ICD-10-CM

## 2023-07-30 DIAGNOSIS — N883 Incompetence of cervix uteri: Secondary | ICD-10-CM

## 2023-07-30 DIAGNOSIS — O0993 Supervision of high risk pregnancy, unspecified, third trimester: Secondary | ICD-10-CM

## 2023-07-30 DIAGNOSIS — O365990 Maternal care for other known or suspected poor fetal growth, unspecified trimester, not applicable or unspecified: Secondary | ICD-10-CM

## 2023-07-30 DIAGNOSIS — Z23 Encounter for immunization: Secondary | ICD-10-CM | POA: Diagnosis not present

## 2023-07-30 DIAGNOSIS — O0990 Supervision of high risk pregnancy, unspecified, unspecified trimester: Secondary | ICD-10-CM

## 2023-07-30 NOTE — Progress Notes (Signed)
HIGH-RISK PREGNANCY VISIT Patient name: April Shah MRN [id number]  Date of birth: 1995-11-10 Chief Complaint:   Routine Prenatal Visit  History of Present Illness:   April Shah is a 27 y.o. G12P0101 female at [redacted]w[redacted]d with an Estimated Date of Delivery: 08/23/23 being seen today for ongoing management of a high-risk pregnancy complicated by:  FGR with elevated UAD Rh neg Prior C-section  Today she reports no complaints.   Contractions: Not present. Denies vaginal bleeding .  Movement: Present. denies leaking of fluid.      02/08/2023   11:05 AM  Depression screen PHQ 2/9  Decreased Interest 3  Down, Depressed, Hopeless 3  PHQ - 2 Score 6  Altered sleeping 2  Tired, decreased energy 3  Change in appetite 1  Feeling bad or failure about yourself  3  Trouble concentrating 0  Moving slowly or fidgety/restless 0  Suicidal thoughts 0  PHQ-9 Score 15     Current Outpatient Medications  Medication Instructions   acyclovir ointment (ZOVIRAX) 5 % 1 application , Topical, Every  3 hours   Prenatal Vit-Fe Fumarate-FA (PRENATAL VITAMIN PO) Oral   progesterone (PROMETRIUM) 200 mg, Vaginal, Daily   valACYclovir (VALTREX) 1,000 mg, Oral, 2 times daily     Review of Systems:   Pertinent items are noted in HPI Denies abnormal vaginal discharge w/ itching/odor/irritation, headaches, visual changes, shortness of breath, chest pain, abdominal pain, severe nausea/vomiting, or problems with urination or bowel movements unless otherwise stated above. Pertinent History Reviewed:  Reviewed past medical,surgical, social, obstetrical and family history.  Reviewed problem list, medications and allergies. Physical Assessment:   Vitals:   07/30/23 1543  BP: 127/79  Pulse: 77  Weight: 166 lb 3.2 oz (75.4 kg)  Body mass index is 31.4 kg/m.           Physical Examination:   General appearance: alert, well appearing, and in no distress  Mental status: normal mood, behavior, speech, dress,  motor activity, and thought processes  Skin: warm & dry   Extremities:      Cardiovascular: normal heart rate noted  Respiratory: normal respiratory effort, no distress  Abdomen: gravid, soft, non-tender  Pelvic: Cervical exam deferred         Fetal Status:     Movement: Present    Fetal Surveillance Testing today: cephalic,BPP 8/8,FHR 148 bpm,posterior placenta gr 3,elevated UAD with EDF,RI .70..71..73=96%,AFI 12.7 cm    Chaperone: N/A    No results found for this or any previous visit (from the past 24 hour(s)).   Assessment & Plan:  High-risk pregnancy: G2P0101 at [redacted]w[redacted]d with an Estimated Date of Delivery: 08/23/23   1) FGR with elevated dopplers -IOL scheduled 12/5 -BPP 8/8, dopplers stable with EDF   2) Prior C-section -desires TOLAC  3) Rh neg, s/p rhoGAM  Meds: No orders of the defined types were placed in this encounter.   Labs/procedures today: BPP/dopplers  Treatment Plan:  as outlined above  Reviewed: Term labor symptoms and general obstetric precautions including but not limited to vaginal bleeding, contractions, leaking of fluid and fetal movement were reviewed in detail with the patient.  All questions were answered. Pt has home bp cuff. Check bp weekly, let us know if >140/90.   Follow-up: Return for IOL scheduled 12/5.   Future Appointments  Date Time Provider Department Center  08/02/2023  7:15 AM [facility] SCHED ROOM [facility] None    Orders Placed This Encounter  Procedures   Flu vaccine trivalent PF,  6mos and older(Flulaval,Afluria,Fluarix,Fluzone)    Myna Hidalgo, DO Attending Obstetrician & Gynecologist, [facility] for [employer], [facility]

## 2023-07-30 NOTE — Progress Notes (Signed)
[place] 36+4 wks,cephalic,BPP 8/8,FHR 148 bpm,posterior placenta gr 3,elevated UAD with EDF,RI .70..71..73=96%,AFI 12.7 cm

## 2023-08-01 ENCOUNTER — Other Ambulatory Visit (HOSPITAL_COMMUNITY): Payer: Self-pay | Admitting: Obstetrics and Gynecology

## 2023-08-01 ENCOUNTER — Other Ambulatory Visit: Payer: Self-pay | Admitting: Advanced Practice Midwife

## 2023-08-02 ENCOUNTER — Other Ambulatory Visit: Payer: 59

## 2023-08-02 ENCOUNTER — Other Ambulatory Visit: Payer: Self-pay

## 2023-08-02 ENCOUNTER — Inpatient Hospital Stay (HOSPITAL_COMMUNITY)
Admission: RE | Admit: 2023-08-02 | Discharge: 2023-08-03 | DRG: 806 | Disposition: A | Discharge status: Home / Self Care | Payer: Medicaid Other | Attending: Obstetrics and Gynecology | Admitting: Obstetrics and Gynecology

## 2023-08-02 ENCOUNTER — Encounter (HOSPITAL_COMMUNITY): Payer: Self-pay | Admitting: Obstetrics and Gynecology

## 2023-08-02 ENCOUNTER — Inpatient Hospital Stay (HOSPITAL_COMMUNITY): Payer: Medicaid Other | Admitting: Anesthesiology

## 2023-08-02 ENCOUNTER — Encounter: Payer: 59 | Admitting: Obstetrics & Gynecology

## 2023-08-02 ENCOUNTER — Inpatient Hospital Stay (HOSPITAL_COMMUNITY): Payer: Medicaid Other

## 2023-08-02 DIAGNOSIS — Z87891 Personal history of nicotine dependence: Secondary | ICD-10-CM

## 2023-08-02 DIAGNOSIS — Z3A37 37 weeks gestation of pregnancy: Secondary | ICD-10-CM

## 2023-08-02 DIAGNOSIS — O0990 Supervision of high risk pregnancy, unspecified, unspecified trimester: Secondary | ICD-10-CM

## 2023-08-02 DIAGNOSIS — O34219 Maternal care for unspecified type scar from previous cesarean delivery: Secondary | ICD-10-CM | POA: Diagnosis present

## 2023-08-02 DIAGNOSIS — B009 Herpesviral infection, unspecified: Secondary | ICD-10-CM | POA: Diagnosis present

## 2023-08-02 DIAGNOSIS — Z833 Family history of diabetes mellitus: Secondary | ICD-10-CM | POA: Diagnosis not present

## 2023-08-02 DIAGNOSIS — O9832 Other infections with a predominantly sexual mode of transmission complicating childbirth: Secondary | ICD-10-CM | POA: Diagnosis present

## 2023-08-02 DIAGNOSIS — A6000 Herpesviral infection of urogenital system, unspecified: Secondary | ICD-10-CM | POA: Diagnosis present

## 2023-08-02 DIAGNOSIS — O34211 Maternal care for low transverse scar from previous cesarean delivery: Secondary | ICD-10-CM | POA: Diagnosis not present

## 2023-08-02 DIAGNOSIS — O365930 Maternal care for other known or suspected poor fetal growth, third trimester, not applicable or unspecified: Secondary | ICD-10-CM | POA: Diagnosis not present

## 2023-08-02 DIAGNOSIS — Z23 Encounter for immunization: Secondary | ICD-10-CM

## 2023-08-02 DIAGNOSIS — O26893 Other specified pregnancy related conditions, third trimester: Secondary | ICD-10-CM | POA: Diagnosis present

## 2023-08-02 DIAGNOSIS — O09899 Supervision of other high risk pregnancies, unspecified trimester: Secondary | ICD-10-CM

## 2023-08-02 DIAGNOSIS — O26899 Other specified pregnancy related conditions, unspecified trimester: Secondary | ICD-10-CM

## 2023-08-02 DIAGNOSIS — Z5986 Financial insecurity: Secondary | ICD-10-CM | POA: Diagnosis not present

## 2023-08-02 DIAGNOSIS — Z6791 Unspecified blood type, Rh negative: Secondary | ICD-10-CM | POA: Diagnosis not present

## 2023-08-02 DIAGNOSIS — Z803 Family history of malignant neoplasm of breast: Secondary | ICD-10-CM | POA: Diagnosis not present

## 2023-08-02 DIAGNOSIS — Z8049 Family history of malignant neoplasm of other genital organs: Secondary | ICD-10-CM | POA: Diagnosis not present

## 2023-08-02 DIAGNOSIS — Z8249 Family history of ischemic heart disease and other diseases of the circulatory system: Secondary | ICD-10-CM | POA: Diagnosis not present

## 2023-08-02 DIAGNOSIS — O365990 Maternal care for other known or suspected poor fetal growth, unspecified trimester, not applicable or unspecified: Secondary | ICD-10-CM | POA: Diagnosis present

## 2023-08-02 DIAGNOSIS — Z98891 History of uterine scar from previous surgery: Secondary | ICD-10-CM

## 2023-08-02 DIAGNOSIS — O09291 Supervision of pregnancy with other poor reproductive or obstetric history, first trimester: Secondary | ICD-10-CM

## 2023-08-02 DIAGNOSIS — Z880 Allergy status to penicillin: Secondary | ICD-10-CM

## 2023-08-02 LAB — CBC
HCT: 32.8 % — ABNORMAL LOW (ref 36.0–46.0)
Hemoglobin: 11.5 g/dL — ABNORMAL LOW (ref 12.0–15.0)
MCH: 29.7 pg (ref 26.0–34.0)
MCHC: 35.1 g/dL (ref 30.0–36.0)
MCV: 84.8 fL (ref 80.0–100.0)
Platelets: 218 10*3/uL (ref 150–400)
RBC: 3.87 MIL/uL (ref 3.87–5.11)
RDW: 12.8 % (ref 11.5–15.5)
WBC: 6 10*3/uL (ref 4.0–10.5)
nRBC: 0 % (ref 0.0–0.2)

## 2023-08-02 LAB — TYPE AND SCREEN
ABO/RH(D): O NEG
Antibody Screen: POSITIVE
DAT, IgG: NEGATIVE
Weak D: POSITIVE

## 2023-08-02 LAB — RPR: RPR Ser Ql: NONREACTIVE

## 2023-08-02 MED ORDER — SOD CITRATE-CITRIC ACID 500-334 MG/5ML PO SOLN
30.0000 mL | ORAL | Status: DC | PRN
  Administered 2023-08-02: 30 mL via ORAL
  Filled 2023-08-02: qty 30

## 2023-08-02 MED ORDER — ONDANSETRON HCL 4 MG/2ML IJ SOLN: 4.0000 mg | INTRAMUSCULAR | Status: DC | PRN

## 2023-08-02 MED ORDER — ACETAMINOPHEN 325 MG PO TABS: 650.0000 mg | ORAL_TABLET | ORAL | Status: DC | PRN

## 2023-08-02 MED ORDER — PHENYLEPHRINE 80 MCG/ML (10ML) SYRINGE FOR IV PUSH (FOR BLOOD PRESSURE SUPPORT): 80.0000 ug | PREFILLED_SYRINGE | INTRAVENOUS | Status: DC | PRN

## 2023-08-02 MED ORDER — OXYTOCIN-SODIUM CHLORIDE 30-0.9 UT/500ML-% IV SOLN
1.0000 m[IU]/min | INTRAVENOUS | Status: DC
  Administered 2023-08-02: 1 m[IU]/min via INTRAVENOUS

## 2023-08-02 MED ORDER — LACTATED RINGERS IV SOLN
500.0000 mL | Freq: Once | INTRAVENOUS | Status: AC
  Administered 2023-08-02: 500 mL via INTRAVENOUS

## 2023-08-02 MED ORDER — NON FORMULARY: 1000.0000 mg | Freq: Two times a day (BID) | Status: DC

## 2023-08-02 MED ORDER — EPHEDRINE 5 MG/ML INJ
10.0000 mg | INTRAVENOUS | Status: DC | PRN
Start: 2023-08-02 — End: 2023-08-02

## 2023-08-02 MED ORDER — LIDOCAINE HCL (PF) 1 % IJ SOLN
30.0000 mL | INTRAMUSCULAR | Status: AC | PRN
  Administered 2023-08-02: 30 mL via SUBCUTANEOUS
  Filled 2023-08-02: qty 30

## 2023-08-02 MED ORDER — IBUPROFEN 600 MG PO TABS
600.0000 mg | ORAL_TABLET | Freq: Four times a day (QID) | ORAL | Status: DC
Start: 2023-08-02 — End: 2023-08-03
  Administered 2023-08-02 – 2023-08-03 (×5): 600 mg via ORAL
  Filled 2023-08-02 (×5): qty 1

## 2023-08-02 MED ORDER — DIPHENHYDRAMINE HCL 50 MG/ML IJ SOLN: 12.5000 mg | INTRAMUSCULAR | Status: DC | PRN

## 2023-08-02 MED ORDER — DIBUCAINE (PERIANAL) 1 % EX OINT: 1.0000 | TOPICAL_OINTMENT | CUTANEOUS | Status: DC | PRN

## 2023-08-02 MED ORDER — HYDROXYZINE HCL 50 MG PO TABS: 50.0000 mg | ORAL_TABLET | Freq: Four times a day (QID) | ORAL | Status: DC | PRN

## 2023-08-02 MED ORDER — TERBUTALINE SULFATE 1 MG/ML IJ SOLN: 0.2500 mg | Freq: Once | INTRAMUSCULAR | Status: DC | PRN

## 2023-08-02 MED ORDER — VALACYCLOVIR HCL 500 MG PO TABS: 1000.0000 mg | ORAL_TABLET | Freq: Two times a day (BID) | ORAL | Status: DC

## 2023-08-02 MED ORDER — TETANUS-DIPHTH-ACELL PERTUSSIS 5-2.5-18.5 LF-MCG/0.5 IM SUSY: 0.5000 mL | PREFILLED_SYRINGE | Freq: Once | INTRAMUSCULAR | Status: DC

## 2023-08-02 MED ORDER — OXYCODONE HCL 5 MG PO TABS
5.0000 mg | ORAL_TABLET | ORAL | Status: DC | PRN
Start: 2023-08-02 — End: 2023-08-03
  Administered 2023-08-02 – 2023-08-03 (×3): 5 mg via ORAL
  Filled 2023-08-02 (×3): qty 1

## 2023-08-02 MED ORDER — BENZOCAINE-MENTHOL 20-0.5 % EX AERO
1.0000 | INHALATION_SPRAY | CUTANEOUS | Status: DC | PRN
  Administered 2023-08-02: 1 via TOPICAL
  Filled 2023-08-02: qty 56

## 2023-08-02 MED ORDER — FENTANYL-BUPIVACAINE-NACL 0.5-0.125-0.9 MG/250ML-% EP SOLN
12.0000 mL/h | EPIDURAL | Status: DC | PRN
  Administered 2023-08-02: 12 mL/h via EPIDURAL
  Filled 2023-08-02: qty 250

## 2023-08-02 MED ORDER — ONDANSETRON HCL 4 MG PO TABS: 4.0000 mg | ORAL_TABLET | ORAL | Status: DC | PRN

## 2023-08-02 MED ORDER — SIMETHICONE 80 MG PO CHEW: 80.0000 mg | CHEWABLE_TABLET | ORAL | Status: DC | PRN

## 2023-08-02 MED ORDER — ACETAMINOPHEN 325 MG PO TABS
650.0000 mg | ORAL_TABLET | ORAL | Status: DC | PRN
  Administered 2023-08-02: 650 mg via ORAL
  Filled 2023-08-02: qty 2

## 2023-08-02 MED ORDER — SENNOSIDES-DOCUSATE SODIUM 8.6-50 MG PO TABS
2.0000 | ORAL_TABLET | Freq: Every day | ORAL | Status: DC
  Administered 2023-08-03: 2 via ORAL
  Filled 2023-08-02: qty 2

## 2023-08-02 MED ORDER — DIPHENHYDRAMINE HCL 25 MG PO CAPS: 25.0000 mg | ORAL_CAPSULE | Freq: Four times a day (QID) | ORAL | Status: DC | PRN

## 2023-08-02 MED ORDER — COCONUT OIL OIL: 1.0000 | TOPICAL_OIL | Status: DC | PRN

## 2023-08-02 MED ORDER — OXYTOCIN-SODIUM CHLORIDE 30-0.9 UT/500ML-% IV SOLN
2.5000 [IU]/h | INTRAVENOUS | Status: DC
  Administered 2023-08-02: 2.5 [IU]/h via INTRAVENOUS
  Filled 2023-08-02: qty 500

## 2023-08-02 MED ORDER — VALACYCLOVIR HCL 500 MG PO TABS
1000.0000 mg | ORAL_TABLET | Freq: Two times a day (BID) | ORAL | Status: DC
  Administered 2023-08-02: 1000 mg via ORAL
  Filled 2023-08-02: qty 2

## 2023-08-02 MED ORDER — BUPIVACAINE HCL (PF) 0.25 % IJ SOLN
INTRAMUSCULAR | Status: DC | PRN
  Administered 2023-08-02 (×2): 5 mL via EPIDURAL

## 2023-08-02 MED ORDER — OXYCODONE HCL 5 MG PO TABS: 10.0000 mg | ORAL_TABLET | ORAL | Status: DC | PRN

## 2023-08-02 MED ORDER — TRANEXAMIC ACID-NACL 1000-0.7 MG/100ML-% IV SOLN
INTRAVENOUS | Status: AC
  Administered 2023-08-02: 1000 mg
  Filled 2023-08-02: qty 100

## 2023-08-02 MED ORDER — LACTATED RINGERS IV SOLN: INTRAVENOUS | Status: DC

## 2023-08-02 MED ORDER — OXYTOCIN-SODIUM CHLORIDE 30-0.9 UT/500ML-% IV SOLN: 1.0000 m[IU]/min | INTRAVENOUS | Status: DC

## 2023-08-02 MED ORDER — FENTANYL CITRATE (PF) 100 MCG/2ML IJ SOLN: 50.0000 ug | INTRAMUSCULAR | Status: DC | PRN

## 2023-08-02 MED ORDER — OXYCODONE-ACETAMINOPHEN 5-325 MG PO TABS: 2.0000 | ORAL_TABLET | ORAL | Status: DC | PRN

## 2023-08-02 MED ORDER — LACTATED RINGERS IV SOLN: 500.0000 mL | INTRAVENOUS | Status: DC | PRN

## 2023-08-02 MED ORDER — OXYTOCIN BOLUS FROM INFUSION
333.0000 mL | Freq: Once | INTRAVENOUS | Status: AC
  Administered 2023-08-02: 333 mL via INTRAVENOUS

## 2023-08-02 MED ORDER — ONDANSETRON HCL 4 MG/2ML IJ SOLN: 4.0000 mg | Freq: Four times a day (QID) | INTRAMUSCULAR | Status: DC | PRN

## 2023-08-02 MED ORDER — OXYCODONE-ACETAMINOPHEN 5-325 MG PO TABS: 1.0000 | ORAL_TABLET | ORAL | Status: DC | PRN

## 2023-08-02 MED ORDER — LIDOCAINE-EPINEPHRINE (PF) 2 %-1:200000 IJ SOLN
INTRAMUSCULAR | Status: DC | PRN
  Administered 2023-08-02: 5 mL via EPIDURAL

## 2023-08-02 MED ORDER — WITCH HAZEL-GLYCERIN EX PADS: 1.0000 | MEDICATED_PAD | CUTANEOUS | Status: DC | PRN

## 2023-08-02 MED ORDER — PRENATAL MULTIVITAMIN CH
1.0000 | ORAL_TABLET | Freq: Every day | ORAL | Status: DC
  Administered 2023-08-02 – 2023-08-03 (×2): 1 via ORAL
  Filled 2023-08-02 (×2): qty 1

## 2023-08-02 NOTE — Discharge Summary (Signed)
Postpartum Discharge Summary  Date of Service updated 08/03/2023     Patient Name: April Shah DOB: [birth year] MRN: [id number]  Date of admission: 08/02/2023 Delivery date:08/02/2023 Delivering provider: Conan Bowens Date of discharge: 08/03/2023  Admitting diagnosis: Encounter for induction of labor [Z34.90] Intrauterine pregnancy: [redacted]w[redacted]d     Secondary diagnosis:  Principal Problem:   Vacuum-assisted vaginal delivery Active Problems:   History of prior pregnancy with short cervix, currently pregnant in first trimester   Supervision of high risk pregnancy, antepartum   History of cesarean delivery   Herpes simplex   History of preterm delivery, currently pregnant   Fetal growth restriction antepartum   VBAC, delivered, current hospitalization   Rh negative status during pregnancy  Additional problems: Vacuum assisted VBAC, Baby to NICU    Discharge diagnosis: Term Pregnancy Delivered                                              Post partum procedures: Rhogam Augmentation: AROM and Pitocin Complications: None  Hospital course: Onset of Labor With Vaginal Delivery      27 y.o. yo [id number] at [redacted]w[redacted]d was admitted in Latent Labor on 08/02/2023. Labor course was complicated by   Membrane Rupture Time/Date: 5:58 AM,08/02/2023  Delivery Method:VBAC, Vacuum Assisted Operative Delivery:Device used:Kiwi and bell Indication: Fetal indications Episiotomy: None Lacerations:  1st degree;Periurethral Patient had a postpartum course complicated by vaginal hematoma.  She is ambulating, tolerating a regular diet, passing flatus, and urinating well. Patient is discharged home in stable condition on 08/03/23.  Newborn Data: Birth date:08/02/2023 Birth time:10:30 AM Gender:Female Living status:Living Apgars:4 ,9  Weight:2320 g  Magnesium Sulfate received: No BMZ received: No Rhophylac:Yes MMR:No T-DaP:Given prenatally Flu: Yes RSV Vaccine received: No Transfusion:No  Immunizations  received: Immunization History  Administered Date(s) Administered   Influenza, Seasonal, Injecte, Preservative Fre 07/30/2023   Rho (D) Immune Globulin 06/07/2023   Tdap 12/06/2021, 05/17/2023    Physical exam  Vitals:   08/02/23 2139 08/03/23 0044 08/03/23 0555 08/03/23 1247  BP: 126/76 113/79 123/73 132/79  Pulse: 71 83 70 93  Resp: 18 18 18 18   Temp: 98.6 F (37 C) 97.6 F (36.4 C) 98.5 F (36.9 C) 98.7 F (37.1 C)  TempSrc:  Oral Oral Oral  SpO2: 100% 99% 100% 100%  Weight:      Height:       General: alert, cooperative, and no distress Lochia: appropriate Uterine Fundus: firm Incision: Decreased size of hematoma with decreased edema. DVT Evaluation: No evidence of DVT seen on physical exam. Negative Homan's sign. No cords or calf tenderness. No significant calf/ankle edema. Labs: Lab Results  Component Value Date   WBC 6.0 08/02/2023   HGB 11.5 (L) 08/02/2023   HCT 32.8 (L) 08/02/2023   MCV 84.8 08/02/2023   PLT 218 08/02/2023      Latest Ref Rng & Units 05/24/2022   11:49 PM  CMP  Glucose 70 - 99 mg/dL 87   BUN 6 - 20 mg/dL 9   Creatinine 2.95 - 1.88 mg/dL 4.16   Sodium 606 - 301 mmol/L 137   Potassium 3.5 - 5.1 mmol/L 3.7   Chloride 98 - 111 mmol/L 108   CO2 22 - 32 mmol/L 20   Calcium 8.9 - 10.3 mg/dL 9.1    Edinburgh Score:     No data to display  No data recorded  After visit meds:  Allergies as of 08/03/2023       Reactions   Penicillins Anaphylaxis        Medication List     STOP taking these medications    progesterone 200 MG capsule Commonly known as: Prometrium       TAKE these medications    acetaminophen 325 MG tablet Commonly known as: Tylenol Take 2 tablets (650 mg total) by mouth every 4 (four) hours as needed (for pain scale < 4).   acyclovir ointment 5 % Commonly known as: ZOVIRAX Apply 1 application topically every 3 (three) hours.   coconut oil Oil Apply 1 Application topically as needed.    ibuprofen 600 MG tablet Commonly known as: ADVIL Take 1 tablet (600 mg total) by mouth every 6 (six) hours.   oxyCODONE 5 MG immediate release tablet Commonly known as: Oxy IR/ROXICODONE Take 1 tablet (5 mg total) by mouth every 4 (four) hours as needed for severe pain (pain score 7-10) or breakthrough pain (pain scale 4-7).   PRENATAL VITAMIN PO Take by mouth.   senna-docusate 8.6-50 MG tablet Commonly known as: Senokot-S Take 2 tablets by mouth daily. Start taking on: August 04, 2023   simethicone 80 MG chewable tablet Commonly known as: MYLICON Chew 1 tablet (80 mg total) by mouth as needed for flatulence.   valACYclovir 1000 MG tablet Commonly known as: Valtrex Take 1 tablet (1,000 mg total) by mouth 2 (two) times daily.   witch hazel-glycerin pad Commonly known as: TUCKS Apply 1 Application topically as needed for hemorrhoids.         Discharge home in stable condition Infant Feeding:  In NICU at time of discharge - Formula Infant Disposition:NICU Discharge instruction: per After Visit Summary and Postpartum booklet. Activity: Advance as tolerated. Pelvic rest for 6 weeks.  Diet: routine diet Future Appointments: Future Appointments  Date Time Provider Department Center  09/06/2023 11:30 AM Cresenzo-Dishmon, Scarlette Calico, CNM [facility]   Follow up Visit:  Message sent to [facility] 12/5  Please schedule this patient for a In person postpartum visit in 6 weeks with the following provider: Any provider. Additional Postpartum F/U: none   High risk pregnancy complicated by:  IUGR Delivery mode:  VBAC, Vacuum Assisted Anticipated Birth Control:   Withdrawal   08/03/2023 Wyn Forster, MD

## 2023-08-02 NOTE — Progress Notes (Signed)
Up to reassess hematoma, is slightly larger, 3 x 4 cm at this point, but no tracking. Will cont ice pack and compression, patient and husband verbalize understanding.  Baldemar Lenis, MD, [facility] Attending Center for [employer] [facility])

## 2023-08-02 NOTE — Lactation Note (Signed)
This note was copied from a baby's chart. Lactation Consultation Note  Patient Name: Girl Nasir Stoltman [id number] Date: 08/02/2023 Age:27 hours Reason for consult: Initial assessment;Early term 37-38.6wks;Infant < 6lbs;Breastfeeding assistance (HSV-uses valtrex)  P1- Infant was born at [redacted]w[redacted]d, weighing 2320 g. MOB reports that infant nursed on the breast after delivery for roughly 3-4 minutes. MOB's feeding plan has always been to both breastfeed and offer formula. LC reviewed LPI/low birth weight feeding guidelines. MOB/FOB verbalized understanding and  agreement.  Feeding plan as follows: -Feeding may not exceed 30 minutes total. -Breastfeed for no longer than 15 minutes. -Offer formula for supplementation, no longer than 15 minutes. -Day 1 supplementation will be minimum of 10-12 mL, day 2 will be 15-18 mL, day 3 will be 20-24 mL.   LC set up DEBP with size 18 mm flanges. Despite being very tired, MOB wanted to pump at this time. LC watched for 7 minutes and some beading was noted. LC reviewed pumping and dumping with outbreaks and/or blood/cracked nipples due to hx of HSV. MOB verbalized understanding and agreement. MOB stated that she had to due this with her first child as well. LC reviewed feeding infant on cue 8-12x in 24 hrs, not allowing infant to go over 3 hrs without a feeding, CDC milk storage guidelines, LC services handout and LPI/low birth weight feeding guidelines. LC encouraged MOB to call for further assistance as needed.  Maternal Data Has patient been taught Hand Expression?: No Does the patient have breastfeeding experience prior to this delivery?: Yes How long did the patient breastfeed?: 1 month  Feeding Mother's Current Feeding Choice: Breast Milk and Formula  Lactation Tools Discussed/Used Tools: Pump;Flanges Flange Size: 18 Breast pump type: Double-Electric Breast Pump;Manual Pump Education: Setup, frequency, and cleaning;Milk Storage Reason for Pumping: LPI/low  birth weight feeding guidelines Pumping frequency: 15-20 min every 2-3 hrs  Interventions Interventions: Breast feeding basics reviewed;Hand pump;DEBP;Education;Pace feeding;LC Services brochure;LPT handout/interventions  Discharge Discharge Education: Warning signs for feeding baby Pump: DEBP;Hands Free;Personal  Consult Status Consult Status: Follow-up Date: 08/03/23 Follow-up type: In-patient    Dema Severin BS, IBCLC 08/02/2023, 6:48 PM

## 2023-08-02 NOTE — Progress Notes (Signed)
In with nurse to assess vaginal/labial hematoma. Patient feeling well. Has firm 2.5 x 3 cm left labial/vaginal wall hematoma, significantly larger than on last exam. Does not appear to be tracking anteriorly or posteriorly, and limited to labia, does not track up vaginal wall. Will re-examine in 1-2 hrs for growth, reviewed management and expected course of expectant management with patient, she verbalizes understanding. Cont ice and pressure.  Baldemar Lenis, MD, [facility] Attending Center for [employer] [facility])

## 2023-08-02 NOTE — Anesthesia Preprocedure Evaluation (Signed)
Anesthesia Evaluation  Patient identified by MRN, date of birth, ID band Patient awake    Reviewed: Allergy & Precautions, NPO status , Patient's Chart, lab work & pertinent test results, reviewed documented beta blocker date and time   History of Anesthesia Complications Negative for: history of anesthetic complications  Airway Mallampati: II  TM Distance: >3 FB Neck ROM: Full    Dental no notable dental hx.    Pulmonary neg COPD, former smoker   breath sounds clear to auscultation       Cardiovascular (-) angina (-) CAD  Rhythm:Regular Rate:Normal     Neuro/Psych neg Seizures    GI/Hepatic ,neg GERD  ,,(+) neg Cirrhosis        Endo/Other  neg diabetes    Renal/GU Renal disease     Musculoskeletal   Abdominal   Peds  Hematology   Anesthesia Other Findings   Reproductive/Obstetrics (+) Pregnancy TOLAC                              Anesthesia Physical Anesthesia Plan  ASA: 2  Anesthesia Plan: Epidural   Post-op Pain Management:    Induction:   PONV Risk Score and Plan:   Airway Management Planned:   Additional Equipment:   Intra-op Plan:   Post-operative Plan:   Informed Consent: I have reviewed the patients History and Physical, chart, labs and discussed the procedure including the risks, benefits and alternatives for the proposed anesthesia with the patient or authorized representative who has indicated his/her understanding and acceptance.       Plan Discussed with:   Anesthesia Plan Comments:          Anesthesia Quick Evaluation

## 2023-08-02 NOTE — H&P (Signed)
OBSTETRIC ADMISSION HISTORY AND PHYSICAL  April Shah is a 27 y.o. female G53P0101 with IUP at [redacted]w[redacted]d by early [place] presenting for TOLAC in s/o IOL 2/2 IUGR. She reports +FMs, No LOF, no VB, no blurry vision, headaches or peripheral edema, and RUQ pain.  She plans on breast feeding. She request withdrawal for birth control. She [facility]   Dating: By early [place] --->  Estimated Date of Delivery: 08/23/23  Sono:    @[redacted]w[redacted]d , CWD, normal anatomy, cephalic presentation, posterior placental lie, 1945g, 3% EFW, HC 3%, AC 0.3%, elevated dopplers 96-97% with EDF, RI 96%   Prenatal History/Complications:  Patient Active Problem List   Diagnosis Date Noted   Fetal growth restriction antepartum 06/07/2023   Short cervix 05/17/2023   Family history of breast cancer 03/15/2023   History of preterm delivery, currently pregnant 02/21/2023   History of prior pregnancy with short cervix, currently pregnant in first trimester 02/08/2023   Supervision of high risk pregnancy, antepartum 02/08/2023   History of cesarean delivery 02/08/2023   Herpes simplex 02/08/2023   NURSING  PROVIDER  Office Location [facility] Dating by U/S at 10 wks  [facility] Model Traditional Anatomy U/S Normal female 'Mei'lani'   Initiated care at  [employer]  English               LAB RESULTS   Support Person   Genetics NIPS: low risk girl AFP-OSB: neg       NT/IT (FT only)        Carrier Screen Horizon:   Rhogam  O/Weak D/-- (06/13 1448) A1C/GTT Early:  Third trimester: normal  Flu Vaccine        TDaP Vaccine  05/17/23 Blood Type O/Weak D/-- (06/13 1448)  Covid Vaccine   Antibody Negative (06/13 1448)      Rubella 2.40 (06/13 1448)  Feeding Plan breast RPR Non Reactive (06/13 1448)  Contraception W/drawal HBsAg Negative (06/13 1448)  Circumcision N/a HIV Non Reactive (06/13 1448)  Pediatrician  [facility] HCVAb Non Reactive (06/13 1448)  Prenatal Classes discussed           Pap          Component Value Date/Time    DIAGPAP   02/08/2023 1114      - Negative for intraepithelial lesion or malignancy (NILM)    HPVHIGH Negative 02/08/2023 1114    ADEQPAP   02/08/2023 1114      Satisfactory for evaluation; transformation zone component ABSENT.       BTLConsent   GC/CT Initial:  -/- 36wks:  -/-  VBAC  Consent   GBS  neg For PCN allergy, check sensitivities            DME Rx [ x] BP cuff [ ]  Weight Scale Waterbirth  [ ]  Class [ ]  Consent [ ]  CNM visit  PHQ9 & GAD7 [ x ] new OB [  ] 28 weeks  [  ] 36 weeks Induction  [ ]  Orders Entered [ ] Foley Y/N     Past Medical History: Past Medical History:  Diagnosis Date   Herpes simplex    Vaginal Pap smear, abnormal     Past Surgical History: Past Surgical History:  Procedure Laterality Date   CESAREAN SECTION     WISDOM TOOTH EXTRACTION      Obstetrical History: OB History  Gravida  2   Para  1   Term      Preterm  1   AB      Living  1      SAB      IAB      Ectopic      Multiple      Live Births  1        Obstetric Comments  Found to be double footling breech at 10cm dilated. LTCS         Social History Social History   Socioeconomic History   Marital status: Single    Spouse name: Not on file   Number of children: Not on file   Years of education: Not on file   Highest education level: Not on file  Occupational History   Not on file  Tobacco Use   Smoking status: Former    Types: Cigarettes   Smokeless tobacco: Never  Vaping Use   Vaping status: Never Used  Substance and Sexual Activity   Alcohol use: Never   Drug use: Never   Sexual activity: Not Currently  Other Topics Concern   Not on file  Social History Narrative   ** Merged History Encounter **       Social Determinants of Health   Financial Resource Strain: Medium Risk (02/08/2023)   Overall Financial Resource Strain (CARDIA)    Difficulty of Paying Living Expenses: Somewhat hard  Food  Insecurity: Food Insecurity Present (02/08/2023)   Hunger Vital Sign    Worried About Running Out of Food in the Last Year: Often true    Ran Out of Food in the Last Year: Often true  Transportation Needs: No Transportation Needs (02/08/2023)   PRAPARE - Administrator, Civil Service (Medical): No    Lack of Transportation (Non-Medical): No  Physical Activity: Sufficiently Active (02/08/2023)   Exercise Vital Sign    Days of Exercise per Week: 5 days    Minutes of Exercise per Session: 30 min  Stress: No Stress Concern Present (02/08/2023)   [employer] of Occupational Health - Occupational Stress Questionnaire    Feeling of Stress : Only a little  Social Connections: Moderately Integrated (02/08/2023)   Social Connection and Isolation Panel [NHANES]    Frequency of Communication with Friends and Family: Twice a week    Frequency of Social Gatherings with Friends and Family: Never    Attends Religious Services: More than 4 times per year    Active Member of [employer] or Organizations: Yes    Attends Engineer, structural: More than 4 times per year    Marital Status: Married    Family History: Family History  Problem Relation Age of Onset   Cervical cancer Mother    Diabetes Maternal Grandmother    Diabetes Maternal Grandfather    Hypertension Paternal Grandmother    Diabetes Paternal Grandmother    Pancreatic cancer Paternal Grandmother    Hypertension Paternal Grandfather    Diabetes Paternal Grandfather     Allergies: Allergies  Allergen Reactions   Penicillins Anaphylaxis    Medications Prior to Admission  Medication Sig Dispense Refill Last Dose   acyclovir ointment (ZOVIRAX) 5 % Apply 1 application topically every 3 (three) hours. 30 g 0    Prenatal Vit-Fe Fumarate-FA (PRENATAL VITAMIN PO) Take by mouth.      progesterone (PROMETRIUM) 200 MG capsule Place 1 capsule (200 mg total) vaginally daily. (Patient not taking: Reported on 07/24/2023) 30  capsule 4    valACYclovir (VALTREX) 1000 MG tablet Take 1 tablet (1,000 mg total) by mouth 2 (two) times daily. 90 tablet 6      Review of Systems   All systems reviewed and negative except as stated in HPI  Last menstrual period 12/08/2022. General appearance: alert, cooperative, and appears stated age Lungs: clear to auscultation bilaterally Heart: regular rate and rhythm Abdomen: soft, non-tender; bowel sounds normal Pelvic: no obvious lesions noted on gross external exam Extremities: Homans sign is negative, no sign of DVT Presentation: cephalic Fetal monitoringBaseline: 130 bpm, Variability: Good {> 6 bpm), Accelerations: Reactive, and Decelerations: Absent Uterine activity q88mins     Prenatal labs: ABO, Rh: O/Weak D/-- (06/13 1448) Antibody: Negative (09/19 0823) Rubella: 2.40 (06/13 1448) RPR: Non Reactive (09/19 0823)  HBsAg: Negative (06/13 1448)  HIV: Non Reactive (09/19 0823)  GBS: --Theda Sers (11/26 0146)  2hr Glucola normal Genetic screening  LR girl, AFP neg Anatomy US normal  Prenatal Transfer Tool  Maternal Diabetes: No Genetic Screening: Normal Maternal Ultrasounds/Referrals: IUGR Fetal Ultrasounds or other Referrals:  Referred to Materal Fetal Medicine  Maternal Substance Abuse:  No Significant Maternal Medications:  Meds include: Other:  Valacyclovir Significant Maternal Lab Results:  Group B Strep negative, HSV Number of Prenatal Visits:greater than 3 verified prenatal visits Other Comments:  None  No results found for this or any previous visit (from the past 24 hour(s)).  Patient Active Problem List   Diagnosis Date Noted   Fetal growth restriction antepartum 06/07/2023   Short cervix 05/17/2023   Family history of breast cancer 03/15/2023   History of preterm delivery, currently pregnant 02/21/2023   History of prior pregnancy with short cervix, currently pregnant in first trimester 02/08/2023   Supervision of high risk pregnancy,  antepartum 02/08/2023   History of cesarean delivery 02/08/2023   Herpes simplex 02/08/2023    Assessment/Plan:  HAASINI YAO is a 27 y.o. G2P0101 at [redacted]w[redacted]d here for TOLAC in s/o IOL 2/2 severe IUGR (EFW 3%, AC 0.3%) with elevated dopplers and EDF, RI 96%  #Labor: Start with pitocin 1x1, if baby tolerating well will advance by 2 after 2U tolerated.  Pt would like an hour or two before epidural and desires an hour after epidural before AROM.  #Pain: Per patient request #FWB: Cat 1 #ID:  GBS neg, HSV #MOF: breast #MOC:withdrawal  #IUGR: @[redacted]w[redacted]d , CWD, normal anatomy, 1945g, 3% EFW, HC 3%, AC 0.3%, elevated dopplers with EDF, RI 96%  #HSV: last outbreak months before pregnancy, prodromal symptoms denied, compliance with Valtrex since 36 weeks, spec exam offered but pt deferred but agreeable to letting team know if she feels any prodromal sxs - prefers to take home supply of Valtrex, non-formulary label to be applied for nursing scanning purposes  #Hx of PTB 2/2 spontaneous PTL, short cervix #Short cervix: s/p vaginal progesterone  #Hx of C/S: 35weeks, breech presentation @10cm   Hessie Dibble, MD  08/02/2023, 12:12 AM

## 2023-08-02 NOTE — Progress Notes (Signed)
Patient is sleeping  now. Talked to RN, she reports patient had some Tylenol for pain but hematoma does not appear to be getting bigger. Will continue current care and close observation.   Jaynie Collins, MD, FACOG Obstetrician & Gynecologist, [facility] for [employer], [facility]

## 2023-08-02 NOTE — Progress Notes (Signed)
Labor Progress Note  April Shah is a 27 y.o. G2P0101 at [redacted]w[redacted]d presented for TOLAC in s/o IOL 2/2 IUGR  S: patient comfortable after epidural, consented for AROM  O:  BP (!) 127/96   Pulse 84   Temp 98.2 F (36.8 C) (Oral)   Resp 17   Ht 5\' 1"  (1.549 m)   Wt 76.2 kg   LMP 12/08/2022   SpO2 99%   BMI 31.74 kg/m  EFM:130bpm/Moderate variability/ 15x15 accels/ None decels CAT: 1 Toco: regular, every 5 minutes   CVE: Dilation: 5.5 Effacement (%): 80 Station: -2 Presentation: Vertex Exam by:: Judd Lien, MD & Letitia Neri, RN   A&P: 27 y.o. [phone] [redacted]w[redacted]d  here for TOLAC IOL as above  #Labor: Progressing well. AROM, copious clear fluid.  Mom and baby tolerated well.   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

## 2023-08-02 NOTE — Anesthesia Procedure Notes (Signed)
Epidural Patient location during procedure: OB Start time: 08/02/2023 4:25 AM End time: 08/02/2023 4:35 AM  Staffing Anesthesiologist: Mariann Barter, MD Performed: anesthesiologist   Preanesthetic Checklist Completed: patient identified, IV checked, site marked, risks and benefits discussed, surgical consent, monitors and equipment checked, pre-op evaluation and timeout performed  Epidural Patient position: sitting Prep: DuraPrep Patient monitoring: heart rate, continuous pulse ox and blood pressure Approach: midline Location: L4-L5 Injection technique: LOR saline  Needle:  Needle type: Tuohy  Needle gauge: 17 G Needle length: 9 cm Needle insertion depth: 5 cm Catheter type: closed end flexible Catheter size: 19 Gauge Catheter at skin depth: 10 cm Test dose: negative and 1.5% lidocaine with Epi 1:200 K  Assessment Events: blood not aspirated, no cerebrospinal fluid, injection not painful, no injection resistance, no paresthesia and negative IV test  Additional Notes Reason for block:procedure for pain

## 2023-08-03 LAB — KLEIHAUER-BETKE STAIN
# Vials RhIg: 1
Fetal Cells %: 0 %
Quantitation Fetal Hemoglobin: 0 mL

## 2023-08-03 MED ORDER — RHO D IMMUNE GLOBULIN 1500 UNIT/2ML IJ SOSY
300.0000 ug | PREFILLED_SYRINGE | Freq: Once | INTRAMUSCULAR | Status: AC
  Administered 2023-08-03: 300 ug via INTRAVENOUS
  Filled 2023-08-03: qty 2

## 2023-08-03 MED ORDER — WITCH HAZEL-GLYCERIN EX PADS: 1.0000 | MEDICATED_PAD | CUTANEOUS | Status: DC | PRN

## 2023-08-03 MED ORDER — ACETAMINOPHEN 325 MG PO TABS: 650.0000 mg | ORAL_TABLET | ORAL | Status: DC | PRN

## 2023-08-03 MED ORDER — IBUPROFEN 600 MG PO TABS: 600.0000 mg | ORAL_TABLET | Freq: Four times a day (QID) | ORAL | 0 refills | Status: AC

## 2023-08-03 MED ORDER — SENNOSIDES-DOCUSATE SODIUM 8.6-50 MG PO TABS: 2.0000 | ORAL_TABLET | Freq: Every day | ORAL | Status: DC

## 2023-08-03 MED ORDER — SIMETHICONE 80 MG PO CHEW: 80.0000 mg | CHEWABLE_TABLET | ORAL | Status: DC | PRN

## 2023-08-03 MED ORDER — COCONUT OIL OIL: 1.0000 | TOPICAL_OIL | Status: AC | PRN

## 2023-08-03 MED ORDER — OXYCODONE HCL 5 MG PO TABS: 5.0000 mg | ORAL_TABLET | ORAL | 0 refills | Status: DC | PRN

## 2023-08-03 NOTE — Progress Notes (Signed)
Post Partum Day 1 s/p VA-VBAC at [redacted]w[redacted]d complicated by L labial hematoma  Subjective: Perineal pain is improved compared to yesterday.  Able to void.  OOB.  Baby is stable at bedside.    Objective: Blood pressure 132/79, pulse 93, temperature 98.7 F (37.1 C), temperature source Oral, resp. rate 18, height 5\' 1"  (1.549 m), weight 76.2 kg, last menstrual period 12/08/2022, SpO2 100%, unknown if currently breastfeeding.  Physical Exam:  General: alert and no distress Pelvic: Decreased size of hematoma with decreased edema noted.  Exam done in presence of RN as chaperone.  Lochia appropriate Uterine Fundus: firm, NT DVT Evaluation: No evidence of DVT seen on physical exam.  Negative Homan's sign.  No cords or calf tenderness.  No significant calf/ankle edema.  Recent Labs    08/02/23 0147  HGB 11.5*  HCT 32.8*   Assessment/Plan: Hematoma has stabilized, will continue ice compresses and pain medications. She is breast and bottle feeding, no issues. Desires fertility awareness method/rhythm method and coitus interruptus for contraception. Continue routine postpartum care. Likely discharge to home tomorrow.   LOS: 1 day   Jaynie Collins, MD 08/03/2023, 6:30 PM

## 2023-08-03 NOTE — Anesthesia Postprocedure Evaluation (Signed)
Anesthesia Post Note  Patient: April Shah  Procedure(s) Performed: AN AD HOC LABOR EPIDURAL     Patient location during evaluation: Mother Baby Anesthesia Type: Epidural Level of consciousness: awake and alert and oriented Pain management: satisfactory to patient Vital Signs Assessment: post-procedure vital signs reviewed and stable Respiratory status: respiratory function stable Cardiovascular status: stable Postop Assessment: no headache, no backache, epidural receding, patient able to bend at knees, no signs of nausea or vomiting, adequate PO intake and able to ambulate Anesthetic complications: no   No notable events documented.  Last Vitals:  Vitals:   08/03/23 0044 08/03/23 0555  BP: 113/79 123/73  Pulse: 83 70  Resp: 18 18  Temp: 36.4 C 36.9 C  SpO2: 99% 100%    Last Pain:  Vitals:   08/03/23 0725  TempSrc:   PainSc: Asleep   Pain Goal:                   Milcah Dulany

## 2023-08-04 LAB — RH IG WORKUP (INCLUDES ABO/RH)
Gestational Age(Wks): 37
Unit division: 0

## 2023-08-06 ENCOUNTER — Other Ambulatory Visit: Payer: 59

## 2023-08-06 LAB — SURGICAL PATHOLOGY

## 2023-08-09 ENCOUNTER — Encounter: Payer: 59 | Admitting: Women's Health

## 2023-08-09 ENCOUNTER — Other Ambulatory Visit: Payer: 59

## 2023-08-13 ENCOUNTER — Telehealth (HOSPITAL_COMMUNITY): Payer: Self-pay | Admitting: *Deleted

## 2023-08-13 ENCOUNTER — Other Ambulatory Visit: Payer: 59

## 2023-08-13 NOTE — Telephone Encounter (Signed)
08/13/2023  Name: April Shah MRN: [id number] DOB: Jan 25, 1996  Reason for Call:  Transition of Care Hospital Discharge Call  Contact Status: Patient Contact Status: Message  Language assistant needed:          Follow-Up Questions:    Inocente Salles Postnatal Depression Scale:  In the Past 7 Days:    PHQ2-9 Depression Scale:     Discharge Follow-up:    Post-discharge interventions: NA  Salena Saner, RN 08/13/2023 11:56

## 2023-08-16 ENCOUNTER — Encounter: Payer: 59 | Admitting: Obstetrics & Gynecology

## 2023-08-16 ENCOUNTER — Other Ambulatory Visit: Payer: 59

## 2023-08-20 ENCOUNTER — Other Ambulatory Visit: Payer: 59

## 2023-08-31 ENCOUNTER — Encounter: Payer: Self-pay | Admitting: *Deleted

## 2023-09-04 ENCOUNTER — Telehealth: Payer: Self-pay | Admitting: *Deleted

## 2023-09-04 NOTE — Telephone Encounter (Signed)
 Newborn nurse reports that patient has scored 13 on her edinburgh. No thoughts of self harm. Pt also reports having stitches still. Will follow at her PP visit.

## 2023-09-06 ENCOUNTER — Encounter: Payer: Self-pay | Admitting: Advanced Practice Midwife

## 2023-09-06 ENCOUNTER — Ambulatory Visit: Payer: Medicaid Other | Admitting: Advanced Practice Midwife

## 2023-09-06 DIAGNOSIS — Z3042 Encounter for surveillance of injectable contraceptive: Secondary | ICD-10-CM | POA: Diagnosis not present

## 2023-09-06 DIAGNOSIS — Z3202 Encounter for pregnancy test, result negative: Secondary | ICD-10-CM

## 2023-09-06 LAB — POCT URINE PREGNANCY: Preg Test, Ur: NEGATIVE

## 2023-09-06 MED ORDER — MEDROXYPROGESTERONE ACETATE 150 MG/ML IM SUSY
150.0000 mg | PREFILLED_SYRINGE | Freq: Once | INTRAMUSCULAR | Status: AC
  Administered 2023-09-06: 150 mg via INTRAMUSCULAR

## 2023-09-06 MED ORDER — MEDROXYPROGESTERONE ACETATE 150 MG/ML IM SUSP: 150.0000 mg | INTRAMUSCULAR | 3 refills | Status: DC

## 2023-09-06 NOTE — Progress Notes (Signed)
 Post Partum Visit Note   Chief Complaint:   Postpartum Care (Want contraceptive patch)  History of Present Illness:   April Shah is a 28 y.o. G21P1102  female being seen today for a postpartum visit. She is 5 weeks postpartum following a vaginal birth after cesarean (VBAC) at 37 gestational weeks. IOL: Yes, for  FGR . Anesthesia: epidural.  Laceration: 1st degree.  Complications: none. Inpatient contraception: no.   Pregnancy complicated by FGR . Tobacco use: no. Substance use disorder: no. Last pap smear:     Component Value Date/Time   DIAGPAP  02/08/2023 1114    - Negative for intraepithelial lesion or malignancy (NILM)   HPVHIGH Negative 02/08/2023 1114   ADEQPAP  02/08/2023 1114    Satisfactory for evaluation; transformation zone component ABSENT.     Next pap smear due: 2027 Patient's last menstrual period was 12/08/2022.  Postpartum course has been uncomplicated. Bleeding no bleeding. Bowel function is normal. Bladder function is normal. Urinary incontinence? No, fecal incontinence? No Patient is not sexually active. Last sexual activity: prior to birth .    Upstream - 09/06/23 1455       Pregnancy Intention Screening   Does the patient want to become pregnant in the next year? No    Does the patient's partner want to become pregnant in the next year? No    Would the patient like to discuss contraceptive options today? No      Contraception Wrap Up   Current Method Contraceptive Patch    End Method Contraceptive Patch    Contraception Counseling Provided Yes            The pregnancy intention screening data noted above was reviewed. Potential methods of contraception were discussed. The patient elected to proceed with Contraceptive Patch.  Edinburgh Postpartum Depression Screening: equivocal  feels anxious  Edinburgh Postnatal Depression Scale - 09/06/23 1557       Edinburgh Postnatal Depression Scale:  In the Past 7 Days   I have been able to laugh and see  the funny side of things. 1    I have looked forward with enjoyment to things. 0    I have blamed myself unnecessarily when things went wrong. 2    I have been anxious or worried for no good reason. 2    I have felt scared or panicky for no good reason. 1    Things have been getting on top of me. 2    I have been so unhappy that I have had difficulty sleeping. 2    I have felt sad or miserable. 1    I have been so unhappy that I have been crying. 1    The thought of harming myself has occurred to me. 0    Edinburgh Postnatal Depression Scale Total 12            Baby's course has been uncomplicated. Baby is feeding by breast and bottle: milk supply adequate. Infant has a pediatrician/family doctor? Yes.  Childcare strategy if returning to work/school: yes.  Pt has material needs met for her and baby: Yes.   Review of Systems:   Pertinent items are noted in HPI Denies Abnormal vaginal discharge w/ itching/odor/irritation, headaches, visual changes, shortness of breath, chest pain, abdominal pain, severe nausea/vomiting, or problems with urination or bowel movements. Pertinent History Reviewed:  Reviewed past medical,surgical, obstetrical and family history.  Reviewed problem list, medications and allergies. OB History  Gravida Para Term Preterm AB Living  2 2 1 1  2   SAB IAB Ectopic Multiple Live Births     0 2    # Outcome Date GA Lbr Len/2nd Weight Sex Type Anes PTL Lv  2 Term 08/02/23 [redacted]w[redacted]d / 02:16 2.32 kg F VBAC, Vacuum EPI, Local  LIV     Birth Comments: WNL  1 Preterm 08/27/14 [redacted]w[redacted]d  1.916 kg F CS-LTranv EPI N LIV     Complications: Short cervix, Breech presentation    Obstetric Comments  Found to be double footling breech at 10cm dilated. LTCS   Physical Assessment:   Vitals:   09/06/23 1437  BP: (!) 138/91  Pulse: 94  Weight: 69.5 kg  Height: 5' 1.3 (1.557 m)  Body mass index is 28.68 kg/m.  Objective:  Blood pressure (!) 138/91, pulse 94, height 5' 1.3  (1.557 m), weight 69.5 kg, last menstrual period 12/08/2022, currently breastfeeding.  General:  alert, cooperative, and no distress   Breasts:  negative  Lungs: Normal respiratory effort  Heart:  regular rate and rhythm  Abdomen: soft, non-tender,    Vulva:  normal  Vagina: normal vagina  Cervix:  normal  Corpus: Well involuted  Adnexa:  not evaluated  Rectal Exam: no hemorrhoids          Results for orders placed or performed in visit on 09/06/23 (from the past 24 hours)  POCT urine pregnancy   Collection Time: 09/06/23  3:35 PM  Result Value Ref Range   Preg Test, Ur Negative Negative    Assessment & Plan:  1) Postpartum exam 2) 5 wks s/p spontaneous vaginal delivery 3) breast & bottle feeding:  try herbal supplement w/fenugreek/pump often 4) Depression screening:  [facility] referral 5) Contraception management: depo  Essential components of care per ACOG recommendations:  1.  Mood and well being:  If positive depression screen, discussed and plan developed.  If using tobacco we discussed reduction/cessation and risk of relapse If current substance abuse, we discussed and referral to local resources was offered.   2. Infant care and feeding:  If breastfeeding, discussed returning to work, pumping, breastfeeding-associated pain, guidance regarding return to fertility while lactating if not using another method. If needed, patient was provided with a letter to be allowed to pump q 2-3hrs to support lactation in a private location with access to a refrigerator to store breastmilk.   Recommended that all caregivers be immunized for flu, pertussis and other preventable communicable diseases If pt does not have material needs met for her/baby, referred to local resources for help obtaining these.  3. Sexuality, contraception and birth spacing Provided guidance regarding sexuality, management of dyspareunia, and resumption of intercourse Discussed avoiding interpregnancy interval  <77mths and recommended birth spacing of 18 months  4. Sleep and fatigue Discussed coping options for fatigue and sleep disruption Encouraged family/partner/community support of 4 hrs of uninterrupted sleep to help with mood and fatigue  5. Physical recovery  If pt had a C/S, assessed incisional pain and providing guidance on normal vs prolonged recovery If pt had a laceration, perineal healing and pain reviewed.  If urinary or fecal incontinence, discussed management and referred to PT or uro/gyn if indicated  Patient is safe to resume physical activity. Discussed attainment of healthy weight.  6.  Chronic disease management Discussed pregnancy complications if any, and their implications for future childbearing and long-term maternal health. Review recommendations for prevention of recurrent pregnancy complications, such as aspirin to reduce risk of preeclampsia no. Pt had GDM: No. If  yes, 2hr GTT scheduled: not applicable. Reviewed medications and non-pregnant dosing including consideration of whether pt is breastfeeding using a reliable resource such as LactMed: not applicable Referred for f/u w/ PCP or subspecialist providers as indicated: not applicable  7. Health maintenance Mammogram at 28yo or earlier if indicated Pap smears as indicated  Meds:  Meds ordered this encounter  Medications   medroxyPROGESTERone  (DEPO-PROVERA ) 150 MG/ML injection    Sig: Inject 1 mL (150 mg total) into the muscle every 3 (three) months.    Dispense:  1 mL    Refill:  3    Supervising Provider:   JAYNE MINDER H [[id number]]   medroxyPROGESTERone  Acetate SUSY 150 mg    Follow-up: No follow-ups on file.   Orders Placed This Encounter  Procedures   POCT urine pregnancy       Cathlean Ely DNP, CNM [facility] for [facility], [facility] 09/07/2023 6:55 AM

## 2023-09-06 NOTE — Patient Instructions (Signed)
 [facility] offers online women's holistic mental health counseling and therapy provided by licensed mental health counselors and therapists.   You can refer yourself using the link below: (if it isn't clickable from your mychart account, copy and paste it in a new browser).  If you have ANY problems, please let me know and I will help troubleshoot.   [email or web address]  OR  [email or web address]

## 2023-09-10 ENCOUNTER — Encounter: Payer: Self-pay | Admitting: Women's Health

## 2023-09-10 ENCOUNTER — Ambulatory Visit: Payer: Medicaid Other | Admitting: Women's Health

## 2023-11-15 IMAGING — DX DG FOOT COMPLETE 3+V*L*
3 series · 3 of 3 positions shown · non-contrast
Comparison: None.

CLINICAL DATA: Strut foot in car door. Fracture 5 years ago.
Lateral foot pain.

EXAM:
LEFT FOOT - COMPLETE 3+ VIEW

[foot ap]
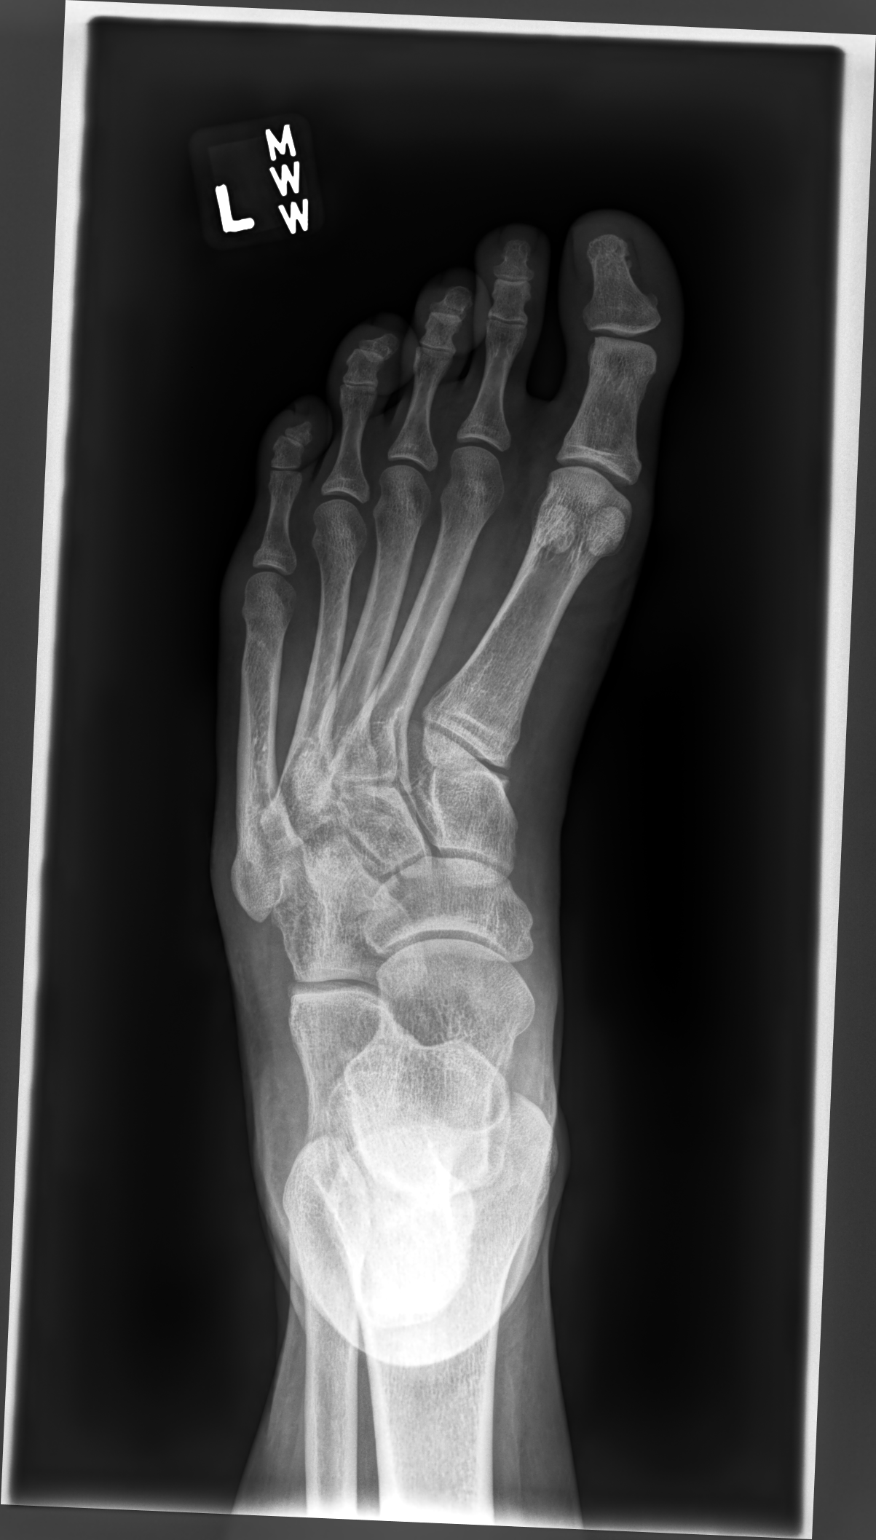

[foot mlo]
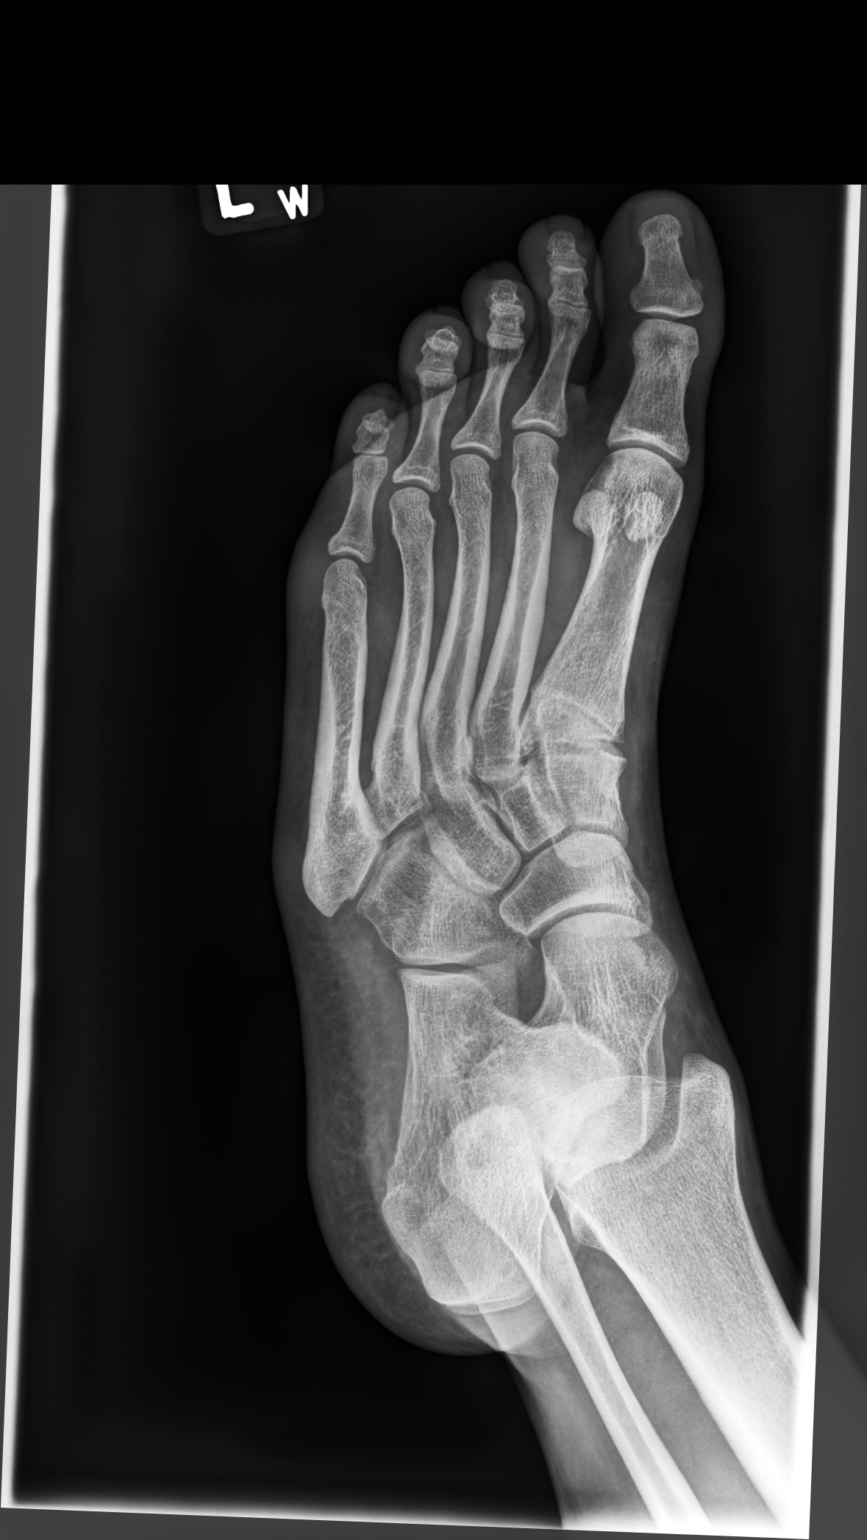

[foot lat]
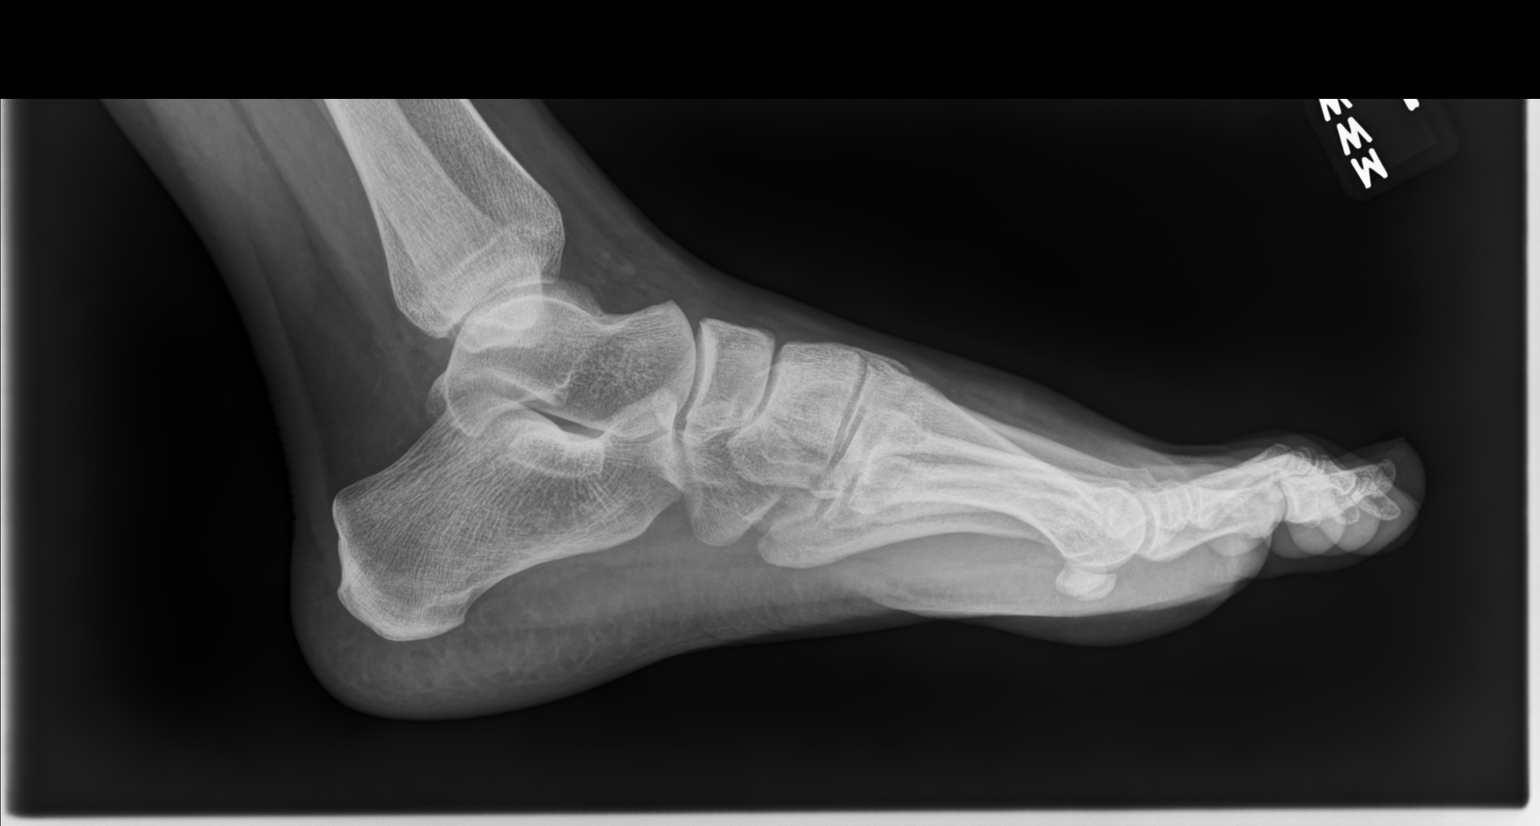

[3 of 3 positions shown; findings below may reference images not displayed]

FINDINGS: There is mild-to-moderate varus angulation of the tarsometatarsal
joints diffusely, appearing chronic. Joint spaces are preserved. No
acute fracture is seen. No dislocation.
IMPRESSION: No acute fracture.

## 2023-11-28 ENCOUNTER — Telehealth: Payer: Self-pay | Admitting: *Deleted

## 2023-11-28 NOTE — Telephone Encounter (Signed)
 Pt states she needs a Depo prescription called into her pharmacy.

## 2023-12-06 ENCOUNTER — Ambulatory Visit: Admitting: *Deleted

## 2023-12-06 DIAGNOSIS — Z3042 Encounter for surveillance of injectable contraceptive: Secondary | ICD-10-CM | POA: Diagnosis not present

## 2023-12-06 MED ORDER — MEDROXYPROGESTERONE ACETATE 150 MG/ML IM SUSP
150.0000 mg | Freq: Once | INTRAMUSCULAR | Status: AC
  Administered 2023-12-06: 150 mg via INTRAMUSCULAR

## 2023-12-06 NOTE — Progress Notes (Signed)
   NURSE VISIT- INJECTION  SUBJECTIVE:  April Shah is a 28 y.o. G29P1102 female here for a Depo Provera for contraception/period management. She is a GYN patient.   OBJECTIVE:  There were no vitals taken for this visit.  Appears well, in no apparent distress  Injection administered in: Left deltoid  Meds ordered this encounter  Medications   medroxyPROGESTERone (DEPO-PROVERA) injection 150 mg    ASSESSMENT: GYN patient Depo Provera for contraception/period management PLAN: Follow-up: in 11-13 weeks for next Depo   Jobe Marker  12/06/2023 10:19 AM

## 2024-02-27 ENCOUNTER — Ambulatory Visit: Admitting: Women's Health

## 2024-02-28 ENCOUNTER — Ambulatory Visit: Admitting: Women's Health

## 2024-02-28 ENCOUNTER — Ambulatory Visit

## 2024-03-06 ENCOUNTER — Ambulatory Visit: Admitting: Adult Health

## 2024-03-06 ENCOUNTER — Encounter: Payer: Self-pay | Admitting: Adult Health

## 2024-03-06 VITALS — BP 126/82 | HR 91 | Ht 61.0 in | Wt 143.5 lb

## 2024-03-06 DIAGNOSIS — Z3202 Encounter for pregnancy test, result negative: Secondary | ICD-10-CM | POA: Diagnosis not present

## 2024-03-06 DIAGNOSIS — Z30011 Encounter for initial prescription of contraceptive pills: Secondary | ICD-10-CM | POA: Diagnosis not present

## 2024-03-06 DIAGNOSIS — B009 Herpesviral infection, unspecified: Secondary | ICD-10-CM | POA: Diagnosis not present

## 2024-03-06 LAB — POCT URINE PREGNANCY: Preg Test, Ur: NEGATIVE

## 2024-03-06 MED ORDER — LO LOESTRIN FE 1 MG-10 MCG / 10 MCG PO TABS: 1.0000 | ORAL_TABLET | Freq: Every day | ORAL | 3 refills | Status: AC

## 2024-03-06 MED ORDER — ACYCLOVIR 5 % EX OINT: 1.0000 | TOPICAL_OINTMENT | CUTANEOUS | 0 refills | Status: AC

## 2024-03-06 NOTE — Progress Notes (Signed)
  Subjective:     Patient ID: April Shah, female   DOB: August 31, 1995, 28 y.o.   MRN: [id number]  HPI Alajia is a 28 year old black female,separated, G2P1102, in to discuss birth control options, she had last depo 12/06/23.  She wants a pill. And she requests refill on acyclovir  ointment.      Component Value Date/Time   DIAGPAP  02/08/2023 1114    - Negative for intraepithelial lesion or malignancy (NILM)   HPVHIGH Negative 02/08/2023 1114   ADEQPAP  02/08/2023 1114    Satisfactory for evaluation; transformation zone component ABSENT.    Review of Systems Wants to discuss birth control Denies MI,stroke, DVT,breast cancer or migraine with aura Reviewed past medical,surgical, social and family history. Reviewed medications and allergies.     Objective:   Physical Exam BP 126/82 (BP Location: Left Arm, Patient Position: Sitting, Cuff Size: Normal)   Pulse 91   Ht 5' 1 (1.549 m)   Wt 143 lb 8 oz (65.1 kg)   Breastfeeding No   BMI 27.11 kg/m  UPT is negative Skin warm and dry. Lungs: clear to ausculation bilaterally. Cardiovascular: regular rate and rhythm.    Fall risk is low  Upstream - 03/06/24 1109       Pregnancy Intention Screening   Does the patient want to become pregnant in the next year? No    Does the patient's partner want to become pregnant in the next year? No    Would the patient like to discuss contraceptive options today? Yes      Contraception Wrap Up   Current Method Hormonal Injection    End Method Oral Contraceptive    Contraception Counseling Provided Yes    How was the end contraceptive method provided? Prescription          Assessment:     1. Pregnancy examination or test, negative result - POCT urine pregnancy  2. Encounter for initial prescription of contraceptive pills (Primary) She wants a pill Denies MI,stroke, DVT, breast cancer or migraine with aura Will rx lo Loestrin , can start today and use condoms for 1 pack  Meds ordered this  encounter  Medications   acyclovir  ointment (ZOVIRAX ) 5 %    Sig: Apply 1 Application topically every 3 (three) hours.    Dispense:  30 g    Refill:  0    Supervising Provider:   JAYNE MINDER H [[id number]]   Norethindrone-Ethinyl Estradiol-Fe Biphas (LO LOESTRIN FE ) 1 MG-10 MCG / 10 MCG tablet    Sig: Take 1 tablet by mouth daily. Take 1 daily by mouth    Dispense:  84 tablet    Refill:  3    BIN [id number], PCN [id number], GRP [id number],PI [id number]    Supervising Provider:   JAYNE MINDER H [[id number]]    Follow up in 3 months for ROS  3. Herpes simplex Will refill acyclovir  ointment      Plan:     Follow up in 3 month for ROS

## 2024-05-30 ENCOUNTER — Ambulatory Visit (INDEPENDENT_AMBULATORY_CARE_PROVIDER_SITE_OTHER): Admitting: Obstetrics & Gynecology

## 2024-05-30 VITALS — BP 131/87 | HR 85 | Ht 61.0 in | Wt 143.0 lb

## 2024-05-30 DIAGNOSIS — Z3202 Encounter for pregnancy test, result negative: Secondary | ICD-10-CM | POA: Diagnosis not present

## 2024-05-30 DIAGNOSIS — Z30017 Encounter for initial prescription of implantable subdermal contraceptive: Secondary | ICD-10-CM | POA: Diagnosis not present

## 2024-05-30 LAB — POCT URINE PREGNANCY: Preg Test, Ur: NEGATIVE

## 2024-05-30 MED ORDER — ETONOGESTREL 68 MG ~~LOC~~ IMPL
68.0000 mg | DRUG_IMPLANT | Freq: Once | SUBCUTANEOUS | Status: AC
  Administered 2024-05-30: 68 mg via SUBCUTANEOUS

## 2024-05-30 NOTE — Progress Notes (Signed)
   GYN VISIT Patient name: April Shah MRN [id number]  Date of birth: 1996/07/22 Chief Complaint:   Contraception  History of Present Illness:   April Shah is a 28 y.o. [phone] female being seen today for Nexplanon  insertion.  Currently using COC's, but notes irregular spotting and issues remembering to take her pills.  She otherwise reports no acute GYN concerns     No LMP recorded. (Menstrual status: Oral contraceptives).    Review of Systems:   Pertinent items are noted in HPI Denies fever/chills, dizziness, headaches, visual disturbances, fatigue, shortness of breath, chest pain, abdominal pain, vomiting, no problems with bowel movements, urination, or intercourse unless otherwise stated above.  Pertinent History Reviewed:   Past Surgical History:  Procedure Laterality Date   CESAREAN SECTION     WISDOM TOOTH EXTRACTION      Past Medical History:  Diagnosis Date   Herpes simplex    Vaginal Pap smear, abnormal    Reviewed problem list, medications and allergies. Physical Assessment:   Vitals:   05/30/24 1047  BP: 131/87  Pulse: 85  Weight: 143 lb (64.9 kg)  Height: 5' 1 (1.549 m)  Body mass index is 27.02 kg/m.       Physical Examination:   General appearance: alert, well appearing, and in no distress  Psych: mood appropriate, normal affect  Skin: warm & dry   Cardiovascular: normal heart rate noted  Respiratory: normal respiratory effort, no distress  Extremities: left arm with prior nexplanon  scars  Chaperone: N/A     NEXPLANON  INSERTION  Risks/benefits/side effects of Nexplanon  have been discussed and her questions have been answered.  She is aware of the common side effect of irregular bleeding, which the incidence of decreases over time. Signed copy of informed consent in chart.   Time out was performed.  She is right-handed, so her left arm, approximately 10cm from the medial epicondyle and 3-5cm posterior to the sulcus, was cleansed with  alcohol and anesthetized with 2cc of 2% Lidocaine .  The area was cleansed again with betadine and the Nexplanon  was inserted per manufacturer's recommendations without difficulty.  3 steri-strips and pressure bandage were applied. The patient tolerated the procedure well.  Assessment & Plan:   1) Nexplanon  insertion Pt was instructed to keep the area clean and dry, remove pressure bandage in 24 hours, and keep insertion site covered with the steri-strip for 3-5 days.  Back up contraception was recommended for 2 weeks.  She was given a card indicating date Nexplanon  was inserted and date it needs to be removed. Follow-up PRN problems.   Orders Placed This Encounter  Procedures   POCT urine pregnancy    Return for June 2026 annual.   Lakeena Downie, DO Attending Obstetrician & Gynecologist, [facility] for [facility], [facility]
